# Patient Record
Sex: Male | Born: 1950 | Race: White | Hispanic: No | Marital: Married | Smoking: Never smoker
Health system: Southern US, Community
[De-identification: ages and names within clinical notes are randomized; demographics above are authoritative.]

## PROBLEM LIST (undated history)

## (undated) DIAGNOSIS — J449 Chronic obstructive pulmonary disease, unspecified: Secondary | ICD-10-CM

## (undated) DIAGNOSIS — G4733 Obstructive sleep apnea (adult) (pediatric): Secondary | ICD-10-CM

## (undated) DIAGNOSIS — E1169 Type 2 diabetes mellitus with other specified complication: Secondary | ICD-10-CM

## (undated) DIAGNOSIS — N1831 Chronic kidney disease, stage 3a: Secondary | ICD-10-CM

## (undated) DIAGNOSIS — E785 Hyperlipidemia, unspecified: Secondary | ICD-10-CM

## (undated) DIAGNOSIS — C689 Malignant neoplasm of urinary organ, unspecified: Secondary | ICD-10-CM

## (undated) DIAGNOSIS — I10 Essential (primary) hypertension: Secondary | ICD-10-CM

## (undated) DIAGNOSIS — R0600 Dyspnea, unspecified: Secondary | ICD-10-CM

## (undated) DIAGNOSIS — I2510 Atherosclerotic heart disease of native coronary artery without angina pectoris: Secondary | ICD-10-CM

## (undated) DIAGNOSIS — E273 Drug-induced adrenocortical insufficiency: Secondary | ICD-10-CM

## (undated) DIAGNOSIS — Z1509 Genetic susceptibility to other malignant neoplasm: Secondary | ICD-10-CM

## (undated) DIAGNOSIS — E119 Type 2 diabetes mellitus without complications: Secondary | ICD-10-CM

## (undated) DIAGNOSIS — I152 Hypertension secondary to endocrine disorders: Secondary | ICD-10-CM

## (undated) DIAGNOSIS — C801 Malignant (primary) neoplasm, unspecified: Secondary | ICD-10-CM

## (undated) HISTORY — PX: NEPHRECTOMY: SHX65

## (undated) HISTORY — PX: APPENDECTOMY: SHX54

## (undated) HISTORY — PX: CARDIAC CATHETERIZATION: SHX172

## (undated) HISTORY — PX: COLON RESECTION: SHX5231

---

## 2016-08-16 ENCOUNTER — Other Ambulatory Visit: Payer: Self-pay | Admitting: Orthopedic Surgery

## 2016-08-16 DIAGNOSIS — M545 Low back pain, unspecified: Secondary | ICD-10-CM

## 2016-08-30 ENCOUNTER — Other Ambulatory Visit: Payer: Self-pay

## 2021-07-09 ENCOUNTER — Emergency Department (HOSPITAL_COMMUNITY): Payer: Medicare HMO

## 2021-07-09 ENCOUNTER — Inpatient Hospital Stay (HOSPITAL_COMMUNITY)
Admission: EM | Admit: 2021-07-09 | Discharge: 2021-07-12 | DRG: 522 | Disposition: A | Discharge status: Home Health | Payer: Medicare HMO | Attending: Student | Admitting: Student

## 2021-07-09 ENCOUNTER — Other Ambulatory Visit: Payer: Self-pay

## 2021-07-09 ENCOUNTER — Encounter (HOSPITAL_COMMUNITY): Payer: Self-pay

## 2021-07-09 DIAGNOSIS — D62 Acute posthemorrhagic anemia: Secondary | ICD-10-CM | POA: Diagnosis present

## 2021-07-09 DIAGNOSIS — N1831 Chronic kidney disease, stage 3a: Secondary | ICD-10-CM | POA: Diagnosis present

## 2021-07-09 DIAGNOSIS — W010XXA Fall on same level from slipping, tripping and stumbling without subsequent striking against object, initial encounter: Secondary | ICD-10-CM | POA: Diagnosis present

## 2021-07-09 DIAGNOSIS — S75001A Unspecified injury of femoral artery, right leg, initial encounter: Secondary | ICD-10-CM | POA: Diagnosis not present

## 2021-07-09 DIAGNOSIS — D631 Anemia in chronic kidney disease: Secondary | ICD-10-CM | POA: Diagnosis present

## 2021-07-09 DIAGNOSIS — I2510 Atherosclerotic heart disease of native coronary artery without angina pectoris: Secondary | ICD-10-CM | POA: Diagnosis present

## 2021-07-09 DIAGNOSIS — E1169 Type 2 diabetes mellitus with other specified complication: Secondary | ICD-10-CM | POA: Diagnosis present

## 2021-07-09 DIAGNOSIS — D696 Thrombocytopenia, unspecified: Secondary | ICD-10-CM

## 2021-07-09 DIAGNOSIS — T380X5A Adverse effect of glucocorticoids and synthetic analogues, initial encounter: Secondary | ICD-10-CM | POA: Diagnosis present

## 2021-07-09 DIAGNOSIS — E119 Type 2 diabetes mellitus without complications: Secondary | ICD-10-CM

## 2021-07-09 DIAGNOSIS — E1165 Type 2 diabetes mellitus with hyperglycemia: Secondary | ICD-10-CM | POA: Diagnosis present

## 2021-07-09 DIAGNOSIS — E273 Drug-induced adrenocortical insufficiency: Secondary | ICD-10-CM | POA: Diagnosis not present

## 2021-07-09 DIAGNOSIS — E2749 Other adrenocortical insufficiency: Secondary | ICD-10-CM | POA: Diagnosis present

## 2021-07-09 DIAGNOSIS — R739 Hyperglycemia, unspecified: Secondary | ICD-10-CM

## 2021-07-09 DIAGNOSIS — E1122 Type 2 diabetes mellitus with diabetic chronic kidney disease: Secondary | ICD-10-CM | POA: Diagnosis present

## 2021-07-09 DIAGNOSIS — G4733 Obstructive sleep apnea (adult) (pediatric): Secondary | ICD-10-CM | POA: Diagnosis present

## 2021-07-09 DIAGNOSIS — R350 Frequency of micturition: Secondary | ICD-10-CM | POA: Diagnosis present

## 2021-07-09 DIAGNOSIS — C652 Malignant neoplasm of left renal pelvis: Secondary | ICD-10-CM | POA: Diagnosis present

## 2021-07-09 DIAGNOSIS — Z79899 Other long term (current) drug therapy: Secondary | ICD-10-CM

## 2021-07-09 DIAGNOSIS — S72001A Fracture of unspecified part of neck of right femur, initial encounter for closed fracture: Secondary | ICD-10-CM | POA: Diagnosis present

## 2021-07-09 DIAGNOSIS — W19XXXA Unspecified fall, initial encounter: Secondary | ICD-10-CM | POA: Diagnosis not present

## 2021-07-09 DIAGNOSIS — D638 Anemia in other chronic diseases classified elsewhere: Secondary | ICD-10-CM | POA: Diagnosis not present

## 2021-07-09 DIAGNOSIS — Z85038 Personal history of other malignant neoplasm of large intestine: Secondary | ICD-10-CM

## 2021-07-09 DIAGNOSIS — C689 Malignant neoplasm of urinary organ, unspecified: Secondary | ICD-10-CM | POA: Diagnosis not present

## 2021-07-09 DIAGNOSIS — G8929 Other chronic pain: Secondary | ICD-10-CM | POA: Diagnosis present

## 2021-07-09 DIAGNOSIS — M47816 Spondylosis without myelopathy or radiculopathy, lumbar region: Secondary | ICD-10-CM | POA: Diagnosis present

## 2021-07-09 DIAGNOSIS — Y92009 Unspecified place in unspecified non-institutional (private) residence as the place of occurrence of the external cause: Secondary | ICD-10-CM | POA: Diagnosis not present

## 2021-07-09 DIAGNOSIS — S72011A Unspecified intracapsular fracture of right femur, initial encounter for closed fracture: Secondary | ICD-10-CM | POA: Diagnosis present

## 2021-07-09 DIAGNOSIS — Z1509 Genetic susceptibility to other malignant neoplasm: Secondary | ICD-10-CM | POA: Diagnosis not present

## 2021-07-09 DIAGNOSIS — E785 Hyperlipidemia, unspecified: Secondary | ICD-10-CM | POA: Diagnosis present

## 2021-07-09 DIAGNOSIS — Z905 Acquired absence of kidney: Secondary | ICD-10-CM

## 2021-07-09 DIAGNOSIS — D509 Iron deficiency anemia, unspecified: Secondary | ICD-10-CM | POA: Diagnosis present

## 2021-07-09 DIAGNOSIS — I10 Essential (primary) hypertension: Secondary | ICD-10-CM | POA: Diagnosis present

## 2021-07-09 DIAGNOSIS — Z91041 Radiographic dye allergy status: Secondary | ICD-10-CM

## 2021-07-09 DIAGNOSIS — R7303 Prediabetes: Secondary | ICD-10-CM | POA: Insufficient documentation

## 2021-07-09 DIAGNOSIS — Z85068 Personal history of other malignant neoplasm of small intestine: Secondary | ICD-10-CM

## 2021-07-09 DIAGNOSIS — I129 Hypertensive chronic kidney disease with stage 1 through stage 4 chronic kidney disease, or unspecified chronic kidney disease: Secondary | ICD-10-CM | POA: Diagnosis not present

## 2021-07-09 DIAGNOSIS — E1159 Type 2 diabetes mellitus with other circulatory complications: Secondary | ICD-10-CM | POA: Diagnosis not present

## 2021-07-09 DIAGNOSIS — Z8553 Personal history of malignant neoplasm of renal pelvis: Secondary | ICD-10-CM

## 2021-07-09 DIAGNOSIS — Z7982 Long term (current) use of aspirin: Secondary | ICD-10-CM

## 2021-07-09 DIAGNOSIS — C787 Secondary malignant neoplasm of liver and intrahepatic bile duct: Secondary | ICD-10-CM | POA: Diagnosis present

## 2021-07-09 DIAGNOSIS — Z951 Presence of aortocoronary bypass graft: Secondary | ICD-10-CM

## 2021-07-09 DIAGNOSIS — Z955 Presence of coronary angioplasty implant and graft: Secondary | ICD-10-CM

## 2021-07-09 DIAGNOSIS — Z20822 Contact with and (suspected) exposure to covid-19: Secondary | ICD-10-CM | POA: Diagnosis present

## 2021-07-09 DIAGNOSIS — I152 Hypertension secondary to endocrine disorders: Secondary | ICD-10-CM | POA: Diagnosis present

## 2021-07-09 HISTORY — DX: Atherosclerotic heart disease of native coronary artery without angina pectoris: I25.10

## 2021-07-09 HISTORY — DX: Drug-induced adrenocortical insufficiency: E27.3

## 2021-07-09 HISTORY — DX: Essential (primary) hypertension: I10

## 2021-07-09 HISTORY — DX: Type 2 diabetes mellitus with other specified complication: E11.69

## 2021-07-09 HISTORY — DX: Genetic susceptibility to other malignant neoplasm: Z15.09

## 2021-07-09 HISTORY — DX: Hypertension secondary to endocrine disorders: I15.2

## 2021-07-09 HISTORY — DX: Malignant (primary) neoplasm, unspecified: C80.1

## 2021-07-09 HISTORY — DX: Chronic kidney disease, stage 3a: N18.31

## 2021-07-09 HISTORY — DX: Type 2 diabetes mellitus with other specified complication: E78.5

## 2021-07-09 HISTORY — DX: Type 2 diabetes mellitus without complications: E11.9

## 2021-07-09 HISTORY — DX: Chronic obstructive pulmonary disease, unspecified: J44.9

## 2021-07-09 HISTORY — DX: Dyspnea, unspecified: R06.00

## 2021-07-09 HISTORY — DX: Malignant neoplasm of urinary organ, unspecified: C68.9

## 2021-07-09 HISTORY — DX: Obstructive sleep apnea (adult) (pediatric): G47.33

## 2021-07-09 LAB — COMPREHENSIVE METABOLIC PANEL
ALT: 24 U/L (ref 0–44)
AST: 24 U/L (ref 15–41)
Albumin: 4 g/dL (ref 3.5–5.0)
Alkaline Phosphatase: 63 U/L (ref 38–126)
Anion gap: 9 (ref 5–15)
BUN: 29 mg/dL — ABNORMAL HIGH (ref 8–23)
CO2: 25 mmol/L (ref 22–32)
Calcium: 9.7 mg/dL (ref 8.9–10.3)
Chloride: 103 mmol/L (ref 98–111)
Creatinine, Ser: 1.31 mg/dL — ABNORMAL HIGH (ref 0.61–1.24)
GFR, Estimated: 58 mL/min — ABNORMAL LOW (ref >60)
Glucose, Bld: 114 mg/dL — ABNORMAL HIGH (ref 70–99)
Potassium: 4.1 mmol/L (ref 3.5–5.1)
Sodium: 137 mmol/L (ref 135–145)
Total Bilirubin: 1 mg/dL (ref 0.3–1.2)
Total Protein: 6.8 g/dL (ref 6.5–8.1)

## 2021-07-09 LAB — CBC WITH DIFFERENTIAL/PLATELET
Abs Immature Granulocytes: 0.1 10*3/uL — ABNORMAL HIGH (ref 0.00–0.07)
Basophils Absolute: 0 10*3/uL (ref 0.0–0.1)
Basophils Relative: 0 %
Eosinophils Absolute: 0 10*3/uL (ref 0.0–0.5)
Eosinophils Relative: 0 %
HCT: 38.9 % — ABNORMAL LOW (ref 39.0–52.0)
Hemoglobin: 12.8 g/dL — ABNORMAL LOW (ref 13.0–17.0)
Immature Granulocytes: 1 %
Lymphocytes Relative: 6 %
Lymphs Abs: 0.6 10*3/uL — ABNORMAL LOW (ref 0.7–4.0)
MCH: 28.9 pg (ref 26.0–34.0)
MCHC: 32.9 g/dL (ref 30.0–36.0)
MCV: 87.8 fL (ref 80.0–100.0)
Monocytes Absolute: 0.4 10*3/uL (ref 0.1–1.0)
Monocytes Relative: 4 %
Neutro Abs: 8.7 10*3/uL — ABNORMAL HIGH (ref 1.7–7.7)
Neutrophils Relative %: 89 %
Platelets: 179 10*3/uL (ref 150–400)
RBC: 4.43 MIL/uL (ref 4.22–5.81)
RDW: 13.4 % (ref 11.5–15.5)
WBC: 9.9 10*3/uL (ref 4.0–10.5)
nRBC: 0 % (ref 0.0–0.2)

## 2021-07-09 LAB — PROTIME-INR
INR: 1.1 (ref 0.8–1.2)
Prothrombin Time: 14.4 seconds (ref 11.4–15.2)

## 2021-07-09 LAB — TROPONIN I (HIGH SENSITIVITY)
Troponin I (High Sensitivity): 11 ng/L (ref <18)
Troponin I (High Sensitivity): 13 ng/L (ref <18)

## 2021-07-09 LAB — APTT: aPTT: 25 seconds (ref 24–36)

## 2021-07-09 MED ORDER — ACETAMINOPHEN 325 MG PO TABS
650.0000 mg | ORAL_TABLET | Freq: Four times a day (QID) | ORAL | Status: DC | PRN
  Administered 2021-07-09: 650 mg via ORAL
  Filled 2021-07-09: qty 2

## 2021-07-09 MED ORDER — OXYCODONE-ACETAMINOPHEN 5-325 MG PO TABS
2.0000 | ORAL_TABLET | Freq: Once | ORAL | Status: AC
  Administered 2021-07-09: 2 via ORAL
  Filled 2021-07-09: qty 2

## 2021-07-09 MED ORDER — SODIUM CHLORIDE 0.9 % IV SOLN: INTRAVENOUS | Status: DC

## 2021-07-09 NOTE — Consult Note (Signed)
Reason for Consult:R hip fracture Referring Physician: [facility] ER  Ian Gray is an 71 y.o. male.  HPI: Patient was seen earlier this afternoon by Dr. Alfonso Ramus at [facility] with c/o R hip pain following a mechanical fall this morning.  Xrays revealed minimally displaced subcapital fracture and he as sent to [facility] ER for admission and workup for planned R hip hemiarthroplasty tomorrow with Dr. French Ana.  Past Medical History:  Diagnosis Date   Cancer ([place])    Hypertension    Lynch syndrome     Past Surgical History:  Procedure Laterality Date   APPENDECTOMY     COLON RESECTION     NEPHRECTOMY      No family history on file.  Social History:  reports that he has never smoked. He has never used smokeless tobacco. He reports that he does not drink alcohol and does not use drugs.  Allergies:  Allergies  Allergen Reactions   Ivp Dye [Iodinated Contrast Media] Hives    Medications: I have reviewed the patient's current medications.  Results for orders placed or performed during the hospital encounter of 07/09/21 (from the past 48 hour(s))  CBC with Differential     Status: Abnormal   Collection Time: 07/09/21  6:07 PM  Result Value Ref Range   WBC 9.9 4.0 - 10.5 K/uL   RBC 4.43 4.22 - 5.81 MIL/uL   Hemoglobin 12.8 (L) 13.0 - 17.0 g/dL   HCT 38.9 (L) 39.0 - 52.0 %   MCV 87.8 80.0 - 100.0 fL   MCH 28.9 26.0 - 34.0 pg   MCHC 32.9 30.0 - 36.0 g/dL   RDW 13.4 11.5 - 15.5 %   Platelets 179 150 - 400 K/uL   nRBC 0.0 0.0 - 0.2 %   Neutrophils Relative % 89 %   Neutro Abs 8.7 (H) 1.7 - 7.7 K/uL   Lymphocytes Relative 6 %   Lymphs Abs 0.6 (L) 0.7 - 4.0 K/uL   Monocytes Relative 4 %   Monocytes Absolute 0.4 0.1 - 1.0 K/uL   Eosinophils Relative 0 %   Eosinophils Absolute 0.0 0.0 - 0.5 K/uL   Basophils Relative 0 %   Basophils Absolute 0.0 0.0 - 0.1 K/uL   Immature Granulocytes 1 %   Abs Immature Granulocytes 0.10 (H) 0.00 - 0.07 K/uL    Comment: Performed at [facility], [address] [place] [address] [place], [place] [address]  Comprehensive metabolic panel     Status: Abnormal   Collection Time: 07/09/21  6:07 PM  Result Value Ref Range   Sodium 137 135 - 145 mmol/L   Potassium 4.1 3.5 - 5.1 mmol/L   Chloride 103 98 - 111 mmol/L   CO2 25 22 - 32 mmol/L   Glucose, Bld 114 (H) 70 - 99 mg/dL    Comment: Glucose reference range applies only to samples taken after fasting for at least 8 hours.   BUN 29 (H) 8 - 23 mg/dL   Creatinine, Ser 1.31 (H) 0.61 - 1.24 mg/dL   Calcium 9.7 8.9 - 10.3 mg/dL   Total Protein 6.8 6.5 - 8.1 g/dL   Albumin 4.0 3.5 - 5.0 g/dL   AST 24 15 - 41 U/L   ALT 24 0 - 44 U/L   Alkaline Phosphatase 63 38 - 126 U/L   Total Bilirubin 1.0 0.3 - 1.2 mg/dL   GFR, Estimated 58 (L) >60 mL/min    Comment: (NOTE) Calculated using the CKD-EPI Creatinine Equation (2021)  Anion gap 9 5 - 15    Comment: Performed at [facility] [address], [place], [place] [address]  Protime-INR     Status: None   Collection Time: 07/09/21  6:07 PM  Result Value Ref Range   Prothrombin Time 14.4 11.4 - 15.2 seconds   INR 1.1 0.8 - 1.2    Comment: (NOTE) INR goal varies based on device and disease states. Performed at [facility], [place] [address] [place], [place] [address]   APTT     Status: None   Collection Time: 07/09/21  6:07 PM  Result Value Ref Range   aPTT 25 24 - 36 seconds    Comment: Performed at [facility] [address], [place], [place] [address]  Troponin I (High Sensitivity)     Status: None   Collection Time: 07/09/21  7:41 PM  Result Value Ref Range   Troponin I (High Sensitivity) 11 <18 ng/L    Comment: (NOTE) Elevated high sensitivity troponin I (hsTnI) values and significant  changes across serial measurements may suggest ACS but many other  chronic and acute conditions are known to elevate hsTnI results.  Refer to the "Links" section for chest pain algorithms and additional  guidance. Performed at  [facility], [place] [address] [place], [place] [address]     CT Head Wo Contrast  Result Date: 07/09/2021 CLINICAL DATA:  Head trauma, minor (Age >= 65y).  Fall. EXAM: CT HEAD WITHOUT CONTRAST TECHNIQUE: Contiguous axial images were obtained from the base of the skull through the vertex without intravenous contrast. RADIATION DOSE REDUCTION: This exam was performed according to the departmental dose-optimization program which includes automated exposure control, adjustment of the mA and/or kV according to patient size and/or use of iterative reconstruction technique. COMPARISON:  None Available. FINDINGS: Brain: No acute intracranial abnormality. Specifically, no hemorrhage, hydrocephalus, mass lesion, acute infarction, or significant intracranial injury. Vascular: No hyperdense vessel or unexpected calcification. Skull: No acute calvarial abnormality. Sinuses/Orbits: No acute findings Other: None IMPRESSION: No acute intracranial abnormality. Electronically Signed   By: Rolm Baptise M.D.   On: 07/09/2021 19:23   DG Chest Port 1 View  Result Date: 07/09/2021 CLINICAL DATA:  [id number].  Preop EXAM: PORTABLE CHEST 1 VIEW COMPARISON:  Chest x-ray 05/16/2019, CT chest 01/31/2019 FINDINGS: Right chest wall Port-A-Cath with tip overlying the right atrium. The heart and mediastinal contours are unchanged. Atherosclerotic plaque. Coronary artery calcification. No focal consolidation. No pulmonary edema. No pleural effusion. No pneumothorax. No acute osseous abnormality. IMPRESSION: 1. No active disease. 2.  Aortic Atherosclerosis (ICD10-I70.0). Electronically Signed   By: Iven Finn M.D.   On: 07/09/2021 19:00   DG Hip Unilat W or Wo Pelvis 2-3 Views Right  Result Date: 07/09/2021 CLINICAL DATA:  Right hip fracture EXAM: DG HIP (WITH OR WITHOUT PELVIS) 2-3V RIGHT COMPARISON:  None Available. FINDINGS: There is an acute right subcapital femoral neck fracture with mild external rotation of the distal  fracture fragment. Femoral head appears seated within the right acetabulum. Mild superimposed right hip degenerative arthritis. Visualized left hip appears intact. Pelvis appears intact. Soft tissues are unremarkable. IMPRESSION: Acute right subcapital femoral neck fracture.  No dislocation. Electronically Signed   By: Fidela Salisbury M.D.   On: 07/09/2021 18:43    Review of Systems  Constitutional:  Negative for fever.  Respiratory:  Negative for shortness of breath.   Cardiovascular:  Negative for chest pain.  Musculoskeletal:  Negative for  gait problem.  Neurological:  Negative for syncope.  Psychiatric/Behavioral:  Negative for confusion.   All other systems reviewed and are negative.  Blood pressure (!) 157/68, pulse 77, temperature 99.8 F (37.7 C), temperature source Oral, resp. rate 16, height 6' (1.829 m), weight 95.3 kg, SpO2 97 %. Physical Exam Constitutional:      Appearance: Normal appearance.  HENT:     Head: Normocephalic.  Eyes:     Extraocular Movements: Extraocular movements intact.     Pupils: Pupils are equal, round, and reactive to light.  Cardiovascular:     Rate and Rhythm: Normal rate.     Pulses: Normal pulses.  Pulmonary:     Effort: Pulmonary effort is normal. No respiratory distress.  Abdominal:     General: Abdomen is flat. There is no distension.     Palpations: Abdomen is soft.  Musculoskeletal:     Cervical back: Normal range of motion.     Comments: RLE NVI, good distal pulses, DF/PF ankle intact, no calf TTP, pain with ROM R hip, leg in slight external rotation in bed.    Skin:    General: Skin is warm and dry.     Findings: No erythema or rash.  Neurological:     General: No focal deficit present.     Mental Status: He is alert and oriented to person, place, and time.  Psychiatric:        Mood and Affect: Mood normal.        Behavior: Behavior normal.     Assessment/Plan: R subcapital hip fracture minimally displaced.    Son at bedside,  we all discussed the plan. Plan is for OR tomorrow with Dr. French Ana for R hip hemiarthroplasty.  He should be admitted per protocol. He will remain NPO after midnight tonight.    Chriss Czar 07/09/2021, 8:46 PM

## 2021-07-09 NOTE — ED Triage Notes (Signed)
Pt's orthopedic Dr Alfonso Ramus sent him here to be admitted to the medicine service in anticipation of ORIF of right hip in AM. Dr French Ana is to be contacted upon pt arrival here.

## 2021-07-09 NOTE — H&P (Signed)
History and Physical    Ian Gray RCV:[id number] DOB: [birth year] DOA: 07/09/2021  PCP: Kristine Linea, MD  Patient coming from: Orthopedic office  I have personally briefly reviewed patient's old medical records in [place]  Chief Complaint: Right hip fracture  HPI: Ian Gray is a 71 y.o. 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  ***  ED Course  Labs/Imaging on admission: I have personally reviewed following labs and imaging studies.  ***  Review of Systems: All systems reviewed and are negative except as documented in history of present illness above.   Past Medical History:  Diagnosis Date   Adrenal insufficiency due to cancer therapy [facility])    CAD (coronary artery disease)    S/p CABG and DES April 2021   Cancer [facility])    Chronic kidney disease, stage 3a ([place])    Hyperlipidemia associated with type 2 diabetes mellitus ([place])    Hypertension    Hypertension associated with diabetes ([place])    Lynch syndrome    OSA (obstructive sleep apnea)    Type 2 diabetes mellitus ([place])    Urothelial cancer ([place])     Past Surgical History:  Procedure Laterality Date   APPENDECTOMY     COLON RESECTION     NEPHRECTOMY      Social History:  reports that he has never smoked. He has never used smokeless tobacco. He reports that he does not drink alcohol and does not use drugs.  Allergies  Allergen Reactions   Ivp Dye [Iodinated Contrast Media] Hives    No  family history on file.   Prior to Admission medications   Medication Sig Start Date End Date Taking? Authorizing Provider  amLODipine (NORVASC) 5 MG tablet Take 5 mg by mouth at bedtime. 05/04/21  Yes [provider]  atorvastatin (LIPITOR) 40 MG tablet Take 40 mg by mouth at bedtime. 07/08/21  Yes [provider]  Erdafitinib (BALVERSA) 4 MG TABS Take 4 mg by mouth at bedtime.   Yes [provider]  ezetimibe (ZETIA) 10 MG tablet Take 10 mg by mouth at bedtime. 06/16/21  Yes [provider]  gabapentin (NEURONTIN) 800 MG tablet Take 800 mg by mouth 4 (four) times daily. 06/09/21  Yes [provider]  HYDROcodone-acetaminophen (NORCO) 10-325 MG tablet Take 1 tablet by mouth 3 (three) times daily. 06/25/21  Yes [provider]  hydrocortisone (CORTEF) 10 MG tablet Take 20 mg by mouth daily. 04/01/21  Yes [provider]  hydrocortisone (CORTEF) 5 MG tablet Take 5 mg by mouth daily at 4 PM. 04/12/21  Yes [provider]  NONFORMULARY OR COMPOUNDED ITEM 1 Dose by Mouth Rinse route at bedtime. Franqui mouthwash- dry mouth/mouth sores caused by chemo   Yes [provider]  ondansetron (ZOFRAN) 8 MG tablet Take 8 mg by mouth every 8 (eight) hours as needed for nausea or vomiting. 02/24/21  Yes [provider]  pantoprazole (PROTONIX) 40 MG tablet Take 40 mg by mouth at bedtime. 05/12/21  Yes [provider]  Polyethyl Glycol-Propyl Glycol (SYSTANE OP) Place 2  drops into both eyes daily.   Yes [provider]  prochlorperazine (COMPAZINE) 10 MG tablet Take 10 mg by mouth every 6 (six) hours as needed for vomiting or nausea. 02/24/21  Yes [provider]  rOPINIRole (REQUIP XL) 2 MG 24 hr tablet Take 2 mg by mouth at bedtime. 05/11/21  Yes [provider]  tamsulosin (FLOMAX) 0.4 MG CAPS capsule Take 0.4 mg by mouth at bedtime. 04/16/21  Yes [provider]  Vitamin D, Ergocalciferol,  (DRISDOL) 1.25 MG (50000 UNIT) CAPS capsule Take 50,000 Units by mouth every Sunday. 05/11/21  Yes [provider]  zolpidem (AMBIEN) 10 MG tablet Take 10 mg by mouth at bedtime. 06/17/21  Yes [provider]    Physical Exam: Vitals:   07/09/21 2115 07/09/21 2130 07/09/21 2255 07/09/21 2300  BP: (!) 162/78 (!) 178/78  (!) 174/81  Pulse: 93 95  94  Resp:  18    Temp:   (!) 100.4 F (38 C)   TempSrc:   Oral   SpO2: 96% 96%  97%  Weight:      Height:       *** Constitutional: NAD, calm, comfortable Eyes: PERRL, lids and conjunctivae normal ENMT: Mucous membranes are moist. Posterior pharynx clear of any exudate or lesions.Normal dentition.  Neck: normal, supple, no masses. Respiratory: clear to auscultation bilaterally, no wheezing, no crackles. Normal respiratory effort. No accessory muscle use.  Cardiovascular: Regular rate and rhythm, no murmurs / rubs / gallops. No extremity edema. 2+ pedal pulses. Abdomen: no tenderness, no masses palpated. No hepatosplenomegaly. Bowel sounds positive.  Musculoskeletal: no clubbing / cyanosis. No joint deformity upper and lower extremities. Good ROM, no contractures. Normal muscle tone.  Skin: no rashes, lesions, ulcers. No induration Neurologic: CN 2-12 grossly intact. Sensation intact. Strength 5/5 in all 4.  Psychiatric: Normal judgment and insight. Alert and oriented x 3. Normal mood.   EKG: Personally reviewed. Limited interpretation due to motion artifact, appears sinus with incomplete RBBB.  No prior for comparison.  Assessment/Plan Principal Problem:   Fracture of femoral neck, right, closed (HCC) Active Problems:   Adrenal insufficiency due to cancer therapy ([place])   CAD (coronary artery disease)   Urothelial cancer (HCC)   Chronic kidney disease, stage 3a ([place])   Hypertension associated with diabetes ([place])   Type 2 diabetes mellitus ([place])   Hyperlipidemia associated with type 2 diabetes mellitus ([place])   Lynch  syndrome   OSA (obstructive sleep apnea)   *** No notes on file *** Assessment and Plan: No notes have been filed under this hospital service. Service: Hospitalist DVT prophylaxis: ***  Code Status: ***  Family Communication: ***  Disposition Plan: ***  Consults called: Orthopedics Severity of Illness: The appropriate patient status for this patient is INPATIENT. Inpatient status is judged to be reasonable and necessary in order to provide the required intensity of service to ensure the patient's safety. The patient's presenting symptoms, physical exam findings, and initial radiographic and laboratory data in the context of their chronic comorbidities is felt to place them at high risk for further clinical deterioration. Furthermore, it is not anticipated that the patient will be medically stable for discharge from the hospital within 2 midnights of admission.   * I certify that at the point of admission it is my clinical judgment that the patient will require inpatient hospital care spanning beyond 2 midnights from the point of admission due to high intensity of service, high risk for further deterioration and  high frequency of surveillance required.Zada Finders MD [facility]  If 7PM-7AM, please contact night-coverage [email or web address]  07/09/2021, 11:44 PM

## 2021-07-09 NOTE — ED Provider Triage Note (Signed)
Emergency Medicine Provider Triage Evaluation Note  Ian Gray , a 71 y.o. male  was evaluated in triage.  Pt had a fall this morning.  Seen at orthopedics.  He was noted to have a right-sided femoral neck fracture.  Sent here to be admitted for over from the morning with Dr. French Ana.  Currently being treated for kidney cancer. Review of Systems  Positive: Pain Negative:   Physical Exam  BP 137/90 (BP Location: Right Arm)   Pulse 92   Temp 99.8 F (37.7 C) (Oral)   Resp 16   Ht 6' (1.829 m)   Wt 95.3 kg   SpO2 97%   BMI 28.48 kg/m  Gen:   Awake, no distress   Resp:  Normal effort  MSK:   Moves extremities without difficulty  Other:    Medical Decision Making  Medically screening exam initiated at 6:09 PM.  Appropriate orders placed.  Gwendolyn Grant was informed that the remainder of the evaluation will be completed by another provider, this initial triage assessment does not replace that evaluation, and the importance of remaining in the ED until their evaluation is complete.  I personally spoke with Dr. French Ana.  He reports the patient does need to be admitted to medicine for him to operate on his first case tomorrow morning.  Will obtain plain films, said he did not need any CT imaging.   Rhae Hammock, [place] 07/09/21 1835

## 2021-07-09 NOTE — Anesthesia Preprocedure Evaluation (Signed)
Anesthesia Evaluation  Patient identified by MRN, date of birth, ID band Patient awake    Reviewed: Allergy & Precautions, NPO status , Patient's Chart, lab work & pertinent test results  Airway Mallampati: II  TM Distance: >3 FB Neck ROM: Full    Dental no notable dental hx.    Pulmonary sleep apnea , COPD,    Pulmonary exam normal        Cardiovascular hypertension, + CAD and + Cardiac Stents   Rhythm:Regular Rate:Normal     Neuro/Psych negative neurological ROS  negative psych ROS   GI/Hepatic negative GI ROS, Neg liver ROS,   Endo/Other  diabetes  Renal/GU CRFRenal disease  negative genitourinary   Musculoskeletal Right hip fx   Abdominal Normal abdominal exam  (+)   Peds  Hematology negative hematology ROS (+)   Anesthesia Other Findings   Reproductive/Obstetrics                           Anesthesia Physical Anesthesia Plan  ASA: 3  Anesthesia Plan: General and Regional   Post-op Pain Management: Regional block* and Tylenol PO (pre-op)*   Induction: Intravenous  PONV Risk Score and Plan: 2 and Ondansetron, Dexamethasone and Treatment may vary due to age or medical condition  Airway Management Planned: Mask and Oral ETT  Additional Equipment: None  Intra-op Plan:   Post-operative Plan: Extubation in OR  Informed Consent: I have reviewed the patients History and Physical, chart, labs and discussed the procedure including the risks, benefits and alternatives for the proposed anesthesia with the patient or authorized representative who has indicated his/her understanding and acceptance.     Dental advisory given  Plan Discussed with: CRNA  Anesthesia Plan Comments: (Lab Results      Component                Value               Date                      WBC                      7.5                 07/10/2021                HGB                      11.5 (L)             07/10/2021                HCT                      34.4 (L)            07/10/2021                MCV                      85.4                07/10/2021                PLT                      139 (L)  07/10/2021           Lab Results      Component                Value               Date                      NA                       137                 07/10/2021                K                        3.7                 07/10/2021                CO2                      25                  07/10/2021                GLUCOSE                  102 (H)             07/10/2021                BUN                      22                  07/10/2021                CREATININE               1.42 (H)            07/10/2021                CALCIUM                  8.8 (L)             07/10/2021                GFRNONAA                 53 (L)              07/10/2021          )       Anesthesia Quick Evaluation

## 2021-07-09 NOTE — ED Provider Notes (Signed)
[facility] EMERGENCY DEPARTMENT Provider Note   CSN: [id number] Arrival date & time: 07/09/21  1621     History  Chief Complaint  Patient presents with   hip surgery    Ian Gray is a 71 y.o. male.  Patient sent in from [facility] was seen by Dr. Lurline Hare there.  Patient sent here to the ED for admission.  Dr. French Ana is planning to operating on him tomorrow morning.  Patient was cleaning at home had a fall has a fracture to his right hip.  Patient also has a bump to the right side of his head.  Patient is also a cancer patient being followed by [facility] hematology oncology in the middle of chemotherapy.  Patient has Lynch syndrome.  Had a left nephrectomy in 21.  Had recurrence in late 22.  Specialized imaging for orthopedics ordered by the triage PA.  Labs ordered as well.  Patient apparently not on any blood thinners.  Past medical history significant for the Lynch syndrome hypertension.  Patient's had left nephrectomy appendectomy and colon resection.  Once we got her additional imaging and labs back we will consult with Dr. French Ana and also contact the hospitalist for unassigned for admission.  In discussion with his son.  Patient hit the right side of his head when he went down when he fell at home.  He was actually walking on the leg.  Raliegh Ip confirmed a right femur fracture.  Patient's EKG raises some concern for subtle ST changes.       Home Medications Prior to Admission medications   Medication Sig Start Date End Date Taking? Authorizing Provider  amLODipine (NORVASC) 5 MG tablet Take 5 mg by mouth at bedtime. 05/04/21  Yes [provider]  atorvastatin (LIPITOR) 40 MG tablet Take 40 mg by mouth at bedtime. 07/08/21  Yes [provider]  ezetimibe (ZETIA) 10 MG tablet Take 10 mg by mouth at bedtime. 06/16/21  Yes [provider]  gabapentin (NEURONTIN) 800 MG tablet Take 800 mg by mouth 4 (four) times daily.  06/09/21  Yes [provider]  HYDROcodone-acetaminophen (NORCO) 10-325 MG tablet Take 1 tablet by mouth 3 (three) times daily. 06/25/21  Yes [provider]  hydrocortisone (CORTEF) 10 MG tablet Take 20 mg by mouth daily. 04/01/21  Yes [provider]  hydrocortisone (CORTEF) 5 MG tablet Take 5 mg by mouth daily at 4 PM. 04/12/21  Yes [provider]  ondansetron (ZOFRAN) 8 MG tablet Take 8 mg by mouth every 8 (eight) hours as needed for nausea or vomiting. 02/24/21  Yes [provider]  pantoprazole (PROTONIX) 40 MG tablet Take 40 mg by mouth at bedtime. 05/12/21  Yes [provider]  prochlorperazine (COMPAZINE) 10 MG tablet Take 10 mg by mouth every 6 (six) hours as needed for vomiting or nausea. 02/24/21  Yes [provider]  rOPINIRole (REQUIP XL) 2 MG 24 hr tablet Take 2 mg by mouth at bedtime. 05/11/21  Yes [provider]  tamsulosin (FLOMAX) 0.4 MG CAPS capsule Take 0.4 mg by mouth at bedtime. 04/16/21  Yes [provider]  Vitamin D, Ergocalciferol, (DRISDOL) 1.25 MG (50000 UNIT) CAPS capsule Take 50,000 Units by mouth every Sunday. 05/11/21  Yes [provider]  zolpidem (AMBIEN) 10 MG tablet Take 10 mg by mouth at bedtime. 06/17/21  Yes [provider]      Allergies    Ivp dye [iodinated contrast media]    Review of  Systems   Review of Systems  Constitutional:  Negative for chills and fever.  HENT:  Negative for ear pain and sore throat.   Eyes:  Negative for pain and visual disturbance.  Respiratory:  Negative for cough and shortness of breath.   Cardiovascular:  Negative for chest pain and palpitations.  Gastrointestinal:  Negative for abdominal pain and vomiting.  Genitourinary:  Negative for dysuria and hematuria.  Musculoskeletal:  Negative for arthralgias and back pain.  Skin:  Negative for color change and rash.  Neurological:  Negative for seizures and syncope.  All other systems  reviewed and are negative.   Physical Exam Updated Vital Signs BP (!) 174/81   Pulse 94   Temp (!) 100.4 F (38 C) (Oral)   Resp 18   Ht 1.829 m (6')   Wt 95.3 kg   SpO2 97%   BMI 28.48 kg/m  Physical Exam Vitals and nursing note reviewed.  Constitutional:      General: He is not in acute distress.    Appearance: Normal appearance. He is well-developed.  HENT:     Head: Normocephalic.     Comments: Little abrasion to the right side of the head. Eyes:     Extraocular Movements: Extraocular movements intact.     Conjunctiva/sclera: Conjunctivae normal.     Pupils: Pupils are equal, round, and reactive to light.  Cardiovascular:     Rate and Rhythm: Normal rate and regular rhythm.     Heart sounds: No murmur heard. Pulmonary:     Effort: Pulmonary effort is normal. No respiratory distress.     Breath sounds: Normal breath sounds.  Abdominal:     Palpations: Abdomen is soft.     Tenderness: There is no abdominal tenderness.  Musculoskeletal:        General: No swelling.     Cervical back: Neck supple.     Comments: Right foot 1+ dorsalis pedis pulse.  Left foot with 1+ dorsalis pedis pulse.  Skin:    General: Skin is warm and dry.     Capillary Refill: Capillary refill takes less than 2 seconds.  Neurological:     General: No focal deficit present.     Mental Status: He is alert and oriented to person, place, and time.     Cranial Nerves: No cranial nerve deficit.     Sensory: No sensory deficit.  Psychiatric:        Mood and Affect: Mood normal.     ED Results / Procedures / Treatments   Labs (all labs ordered are listed, but only abnormal results are displayed) Labs Reviewed  CBC WITH DIFFERENTIAL/PLATELET - Abnormal; Notable for the following components:      Result Value   Hemoglobin 12.8 (*)    HCT 38.9 (*)    Neutro Abs 8.7 (*)    Lymphs Abs 0.6 (*)    Abs Immature Granulocytes 0.10 (*)    All other components within normal limits  COMPREHENSIVE  METABOLIC PANEL - Abnormal; Notable for the following components:   Glucose, Bld 114 (*)    BUN 29 (*)    Creatinine, Ser 1.31 (*)    GFR, Estimated 58 (*)    All other components within normal limits  SARS CORONAVIRUS 2 BY RT PCR  MRSA CULTURE  PROTIME-INR  APTT  TROPONIN I (HIGH SENSITIVITY)  TROPONIN I (HIGH SENSITIVITY)    EKG EKG Interpretation  Date/Time:  Friday July 09 2021 18:06:37 EDT Ventricular Rate:  96 PR  Interval:    QRS Duration: 106 QT Interval:  352 QTC Calculation: 444 R Axis:   49 Text Interpretation: Accelerated Junctional rhythm Incomplete right bundle branch block ST & T wave abnormality, consider anterior ischemia Abnormal ECG No previous ECGs available No previous ECGs available Confirmed by Fredia Sorrow [phone]) on 07/09/2021 7:20:16 PM  Radiology CT Head Wo Contrast  Result Date: 07/09/2021 CLINICAL DATA:  Head trauma, minor (Age >= 65y).  Fall. EXAM: CT HEAD WITHOUT CONTRAST TECHNIQUE: Contiguous axial images were obtained from the base of the skull through the vertex without intravenous contrast. RADIATION DOSE REDUCTION: This exam was performed according to the departmental dose-optimization program which includes automated exposure control, adjustment of the mA and/or kV according to patient size and/or use of iterative reconstruction technique. COMPARISON:  None Available. FINDINGS: Brain: No acute intracranial abnormality. Specifically, no hemorrhage, hydrocephalus, mass lesion, acute infarction, or significant intracranial injury. Vascular: No hyperdense vessel or unexpected calcification. Skull: No acute calvarial abnormality. Sinuses/Orbits: No acute findings Other: None IMPRESSION: No acute intracranial abnormality. Electronically Signed   By: Rolm Baptise M.D.   On: 07/09/2021 19:23   DG Chest Port 1 View  Result Date: 07/09/2021 CLINICAL DATA:  [id number].  Preop EXAM: PORTABLE CHEST 1 VIEW COMPARISON:  Chest x-ray 05/16/2019, CT chest 01/31/2019  FINDINGS: Right chest wall Port-A-Cath with tip overlying the right atrium. The heart and mediastinal contours are unchanged. Atherosclerotic plaque. Coronary artery calcification. No focal consolidation. No pulmonary edema. No pleural effusion. No pneumothorax. No acute osseous abnormality. IMPRESSION: 1. No active disease. 2.  Aortic Atherosclerosis (ICD10-I70.0). Electronically Signed   By: Iven Finn M.D.   On: 07/09/2021 19:00   DG Hip Unilat W or Wo Pelvis 2-3 Views Right  Result Date: 07/09/2021 CLINICAL DATA:  Right hip fracture EXAM: DG HIP (WITH OR WITHOUT PELVIS) 2-3V RIGHT COMPARISON:  None Available. FINDINGS: There is an acute right subcapital femoral neck fracture with mild external rotation of the distal fracture fragment. Femoral head appears seated within the right acetabulum. Mild superimposed right hip degenerative arthritis. Visualized left hip appears intact. Pelvis appears intact. Soft tissues are unremarkable. IMPRESSION: Acute right subcapital femoral neck fracture.  No dislocation. Electronically Signed   By: Fidela Salisbury M.D.   On: 07/09/2021 18:43    Procedures Procedures    Medications Ordered in ED Medications  0.9 %  sodium chloride infusion (has no administration in time range)  acetaminophen (TYLENOL) tablet 650 mg (has no administration in time range)  oxyCODONE-acetaminophen (PERCOCET/ROXICET) 5-325 MG per tablet 2 tablet (2 tablets Oral Given 07/09/21 1808)    ED Course/ Medical Decision Making/ A&P                           Medical Decision Making Amount and/or Complexity of Data Reviewed Radiology: ordered.  Risk OTC drugs. Prescription drug management. Decision regarding hospitalization.   Patient EKG rates with concerns about some subtle changes.  Questionable anterior ischemia.  Troponins x2 first 1 was 11-second 1 was 13 no acute change.  CBC no leukocytosis hemoglobin 12.8.  Platelets 179.  Complete metabolic panel significant for BUN of 29  creatinine of 1.31 for a GFR 58.  Do not have a comparison.   CT head without any acute findings.  X-rays of the right hip shows a subcapital femoral neck fracture on the right side.  Chest x-ray no active disease.  Patient INR is normal at 1.1.  Patient followed by  [place] cardiology followed by hematology oncology [facility].  Actively receiving chemotherapy.  For the metastatic urothelial cancer.  Patient's had a left nephrectomy for that.  Just notified by nursing that he is now got a temp to 100.5.  Patient will be given some Tylenol.  Discussed with hospitalist they will admit.  Patient is currently slotted for repair of the hip first thing in the morning by [place].   Final Clinical Impression(s) / ED Diagnoses Final diagnoses:  Fall, initial encounter  Closed fracture of neck of right femur, initial encounter [facility])    Rx / DC Orders ED Discharge Orders     None         Fredia Sorrow, MD 07/09/21 2327

## 2021-07-10 ENCOUNTER — Encounter (HOSPITAL_COMMUNITY)
Admission: EM | Disposition: A | Discharge status: Home Health | Payer: Self-pay | Source: Home / Self Care | Attending: Student

## 2021-07-10 ENCOUNTER — Inpatient Hospital Stay (HOSPITAL_COMMUNITY): Payer: Medicare HMO | Admitting: Anesthesiology

## 2021-07-10 ENCOUNTER — Inpatient Hospital Stay (HOSPITAL_COMMUNITY): Payer: Medicare HMO

## 2021-07-10 ENCOUNTER — Inpatient Hospital Stay: Admit: 2021-07-10 | Payer: Medicare HMO | Admitting: Orthopedic Surgery

## 2021-07-10 ENCOUNTER — Encounter (HOSPITAL_COMMUNITY): Payer: Self-pay | Admitting: Internal Medicine

## 2021-07-10 ENCOUNTER — Other Ambulatory Visit: Payer: Self-pay

## 2021-07-10 DIAGNOSIS — E273 Drug-induced adrenocortical insufficiency: Secondary | ICD-10-CM | POA: Diagnosis not present

## 2021-07-10 DIAGNOSIS — G4733 Obstructive sleep apnea (adult) (pediatric): Secondary | ICD-10-CM

## 2021-07-10 DIAGNOSIS — D638 Anemia in other chronic diseases classified elsewhere: Secondary | ICD-10-CM | POA: Diagnosis not present

## 2021-07-10 DIAGNOSIS — E785 Hyperlipidemia, unspecified: Secondary | ICD-10-CM

## 2021-07-10 DIAGNOSIS — S75001A Unspecified injury of femoral artery, right leg, initial encounter: Secondary | ICD-10-CM

## 2021-07-10 DIAGNOSIS — I2510 Atherosclerotic heart disease of native coronary artery without angina pectoris: Secondary | ICD-10-CM

## 2021-07-10 DIAGNOSIS — W19XXXA Unspecified fall, initial encounter: Secondary | ICD-10-CM | POA: Diagnosis not present

## 2021-07-10 DIAGNOSIS — E1122 Type 2 diabetes mellitus with diabetic chronic kidney disease: Secondary | ICD-10-CM

## 2021-07-10 DIAGNOSIS — G8929 Other chronic pain: Secondary | ICD-10-CM

## 2021-07-10 DIAGNOSIS — S72001A Fracture of unspecified part of neck of right femur, initial encounter for closed fracture: Secondary | ICD-10-CM | POA: Diagnosis not present

## 2021-07-10 DIAGNOSIS — I129 Hypertensive chronic kidney disease with stage 1 through stage 4 chronic kidney disease, or unspecified chronic kidney disease: Secondary | ICD-10-CM | POA: Diagnosis not present

## 2021-07-10 DIAGNOSIS — D696 Thrombocytopenia, unspecified: Secondary | ICD-10-CM

## 2021-07-10 DIAGNOSIS — I152 Hypertension secondary to endocrine disorders: Secondary | ICD-10-CM

## 2021-07-10 DIAGNOSIS — N1831 Chronic kidney disease, stage 3a: Secondary | ICD-10-CM

## 2021-07-10 DIAGNOSIS — E1159 Type 2 diabetes mellitus with other circulatory complications: Secondary | ICD-10-CM

## 2021-07-10 DIAGNOSIS — C689 Malignant neoplasm of urinary organ, unspecified: Secondary | ICD-10-CM

## 2021-07-10 DIAGNOSIS — E1169 Type 2 diabetes mellitus with other specified complication: Secondary | ICD-10-CM

## 2021-07-10 HISTORY — PX: TOTAL HIP ARTHROPLASTY: SHX124

## 2021-07-10 LAB — SURGICAL PCR SCREEN
MRSA, PCR: POSITIVE — AB
Staphylococcus aureus: POSITIVE — AB

## 2021-07-10 LAB — TYPE AND SCREEN
ABO/RH(D): B NEG
Antibody Screen: NEGATIVE

## 2021-07-10 LAB — CBC
HCT: 34.4 % — ABNORMAL LOW (ref 39.0–52.0)
Hemoglobin: 11.5 g/dL — ABNORMAL LOW (ref 13.0–17.0)
MCH: 28.5 pg (ref 26.0–34.0)
MCHC: 33.4 g/dL (ref 30.0–36.0)
MCV: 85.4 fL (ref 80.0–100.0)
Platelets: 139 10*3/uL — ABNORMAL LOW (ref 150–400)
RBC: 4.03 MIL/uL — ABNORMAL LOW (ref 4.22–5.81)
RDW: 13.3 % (ref 11.5–15.5)
WBC: 7.5 10*3/uL (ref 4.0–10.5)
nRBC: 0 % (ref 0.0–0.2)

## 2021-07-10 LAB — BASIC METABOLIC PANEL
Anion gap: 10 (ref 5–15)
BUN: 22 mg/dL (ref 8–23)
CO2: 25 mmol/L (ref 22–32)
Calcium: 8.8 mg/dL — ABNORMAL LOW (ref 8.9–10.3)
Chloride: 102 mmol/L (ref 98–111)
Creatinine, Ser: 1.42 mg/dL — ABNORMAL HIGH (ref 0.61–1.24)
GFR, Estimated: 53 mL/min — ABNORMAL LOW (ref >60)
Glucose, Bld: 102 mg/dL — ABNORMAL HIGH (ref 70–99)
Potassium: 3.7 mmol/L (ref 3.5–5.1)
Sodium: 137 mmol/L (ref 135–145)

## 2021-07-10 LAB — ABO/RH: ABO/RH(D): B NEG

## 2021-07-10 LAB — GLUCOSE, CAPILLARY: Glucose-Capillary: 121 mg/dL — ABNORMAL HIGH (ref 70–99)

## 2021-07-10 LAB — SARS CORONAVIRUS 2 BY RT PCR: SARS Coronavirus 2 by RT PCR: NEGATIVE

## 2021-07-10 SURGERY — ARTHROPLASTY, HIP, TOTAL,POSTERIOR APPROACH
Anesthesia: Regional | Site: Hip | Laterality: Right

## 2021-07-10 MED ORDER — DEXAMETHASONE SODIUM PHOSPHATE 10 MG/ML IJ SOLN
INTRAMUSCULAR | Status: DC | PRN
  Administered 2021-07-10: 10 mg via INTRAVENOUS

## 2021-07-10 MED ORDER — CEFAZOLIN SODIUM-DEXTROSE 2-4 GM/100ML-% IV SOLN
2.0000 g | INTRAVENOUS | Status: AC
Start: 2021-07-10 — End: 2021-07-10
  Administered 2021-07-10: 2 g via INTRAVENOUS

## 2021-07-10 MED ORDER — METHYLPREDNISOLONE SODIUM SUCC 40 MG IJ SOLR: 25.0000 mg | INTRAMUSCULAR | Status: DC

## 2021-07-10 MED ORDER — ROCURONIUM BROMIDE 10 MG/ML (PF) SYRINGE
PREFILLED_SYRINGE | INTRAVENOUS | Status: DC | PRN
  Administered 2021-07-10: 20 mg via INTRAVENOUS
  Administered 2021-07-10: 60 mg via INTRAVENOUS

## 2021-07-10 MED ORDER — METOCLOPRAMIDE HCL 5 MG/ML IJ SOLN: 5.0000 mg | Freq: Three times a day (TID) | INTRAMUSCULAR | Status: DC | PRN

## 2021-07-10 MED ORDER — ROCURONIUM BROMIDE 10 MG/ML (PF) SYRINGE
PREFILLED_SYRINGE | INTRAVENOUS | Status: AC
  Filled 2021-07-10: qty 10

## 2021-07-10 MED ORDER — ACETAMINOPHEN 500 MG PO TABS
1000.0000 mg | ORAL_TABLET | Freq: Four times a day (QID) | ORAL | Status: AC
  Administered 2021-07-10 – 2021-07-11 (×4): 1000 mg via ORAL
  Filled 2021-07-10 (×4): qty 2

## 2021-07-10 MED ORDER — METOCLOPRAMIDE HCL 5 MG PO TABS: 5.0000 mg | ORAL_TABLET | Freq: Three times a day (TID) | ORAL | Status: DC | PRN

## 2021-07-10 MED ORDER — HYDROCORTISONE 5 MG PO TABS
5.0000 mg | ORAL_TABLET | Freq: Every day | ORAL | Status: DC
Start: 2021-07-11 — End: 2021-07-10

## 2021-07-10 MED ORDER — ONDANSETRON HCL 4 MG/2ML IJ SOLN
INTRAMUSCULAR | Status: DC | PRN
  Administered 2021-07-10: 4 mg via INTRAVENOUS

## 2021-07-10 MED ORDER — ACETAMINOPHEN 10 MG/ML IV SOLN
INTRAVENOUS | Status: DC | PRN
  Administered 2021-07-10: 1000 mg via INTRAVENOUS

## 2021-07-10 MED ORDER — SODIUM CHLORIDE 0.9 % IV SOLN: INTRAVENOUS | Status: DC

## 2021-07-10 MED ORDER — FENTANYL CITRATE (PF) 250 MCG/5ML IJ SOLN
INTRAMUSCULAR | Status: DC | PRN
  Administered 2021-07-10 (×2): 50 ug via INTRAVENOUS
  Administered 2021-07-10: 25 ug via INTRAVENOUS
  Administered 2021-07-10: 50 ug via INTRAVENOUS
  Administered 2021-07-10: 25 ug via INTRAVENOUS

## 2021-07-10 MED ORDER — ASPIRIN 81 MG PO TBEC: 81.0000 mg | DELAYED_RELEASE_TABLET | Freq: Two times a day (BID) | ORAL | 0 refills | Status: AC

## 2021-07-10 MED ORDER — ONDANSETRON HCL 4 MG/2ML IJ SOLN
INTRAMUSCULAR | Status: AC
  Filled 2021-07-10: qty 2

## 2021-07-10 MED ORDER — BUPIVACAINE-EPINEPHRINE 0.5% -1:200000 IJ SOLN
INTRAMUSCULAR | Status: DC | PRN
  Administered 2021-07-10: 20 mL

## 2021-07-10 MED ORDER — ALBUMIN HUMAN 5 % IV SOLN: INTRAVENOUS | Status: DC | PRN

## 2021-07-10 MED ORDER — ATORVASTATIN CALCIUM 40 MG PO TABS
40.0000 mg | ORAL_TABLET | Freq: Every day | ORAL | Status: DC
  Administered 2021-07-10 – 2021-07-11 (×2): 40 mg via ORAL
  Filled 2021-07-10 (×2): qty 1

## 2021-07-10 MED ORDER — BUPIVACAINE LIPOSOME 1.3 % IJ SUSP
INTRAMUSCULAR | Status: AC
  Filled 2021-07-10: qty 20

## 2021-07-10 MED ORDER — MUPIROCIN 2 % EX OINT
1.0000 "application " | TOPICAL_OINTMENT | Freq: Once | CUTANEOUS | Status: AC
  Administered 2021-07-10: 1 via TOPICAL
  Filled 2021-07-10: qty 22

## 2021-07-10 MED ORDER — PROPOFOL 10 MG/ML IV BOLUS
INTRAVENOUS | Status: AC
  Filled 2021-07-10: qty 20

## 2021-07-10 MED ORDER — DEXAMETHASONE SODIUM PHOSPHATE 10 MG/ML IJ SOLN
INTRAMUSCULAR | Status: AC
  Filled 2021-07-10: qty 1

## 2021-07-10 MED ORDER — TAMSULOSIN HCL 0.4 MG PO CAPS
0.4000 mg | ORAL_CAPSULE | Freq: Every day | ORAL | Status: DC
  Administered 2021-07-10 – 2021-07-11 (×2): 0.4 mg via ORAL
  Filled 2021-07-10 (×2): qty 1

## 2021-07-10 MED ORDER — PANTOPRAZOLE SODIUM 40 MG PO TBEC
40.0000 mg | DELAYED_RELEASE_TABLET | Freq: Every day | ORAL | Status: DC
  Administered 2021-07-10 – 2021-07-11 (×2): 40 mg via ORAL
  Filled 2021-07-10 (×2): qty 1

## 2021-07-10 MED ORDER — ONDANSETRON HCL 4 MG/2ML IJ SOLN
4.0000 mg | Freq: Four times a day (QID) | INTRAMUSCULAR | Status: DC | PRN
  Filled 2021-07-10: qty 2

## 2021-07-10 MED ORDER — HYDROMORPHONE HCL 1 MG/ML IJ SOLN
0.5000 mg | INTRAMUSCULAR | Status: DC | PRN
  Administered 2021-07-10 – 2021-07-12 (×3): 1 mg via INTRAVENOUS
  Filled 2021-07-10 (×3): qty 1

## 2021-07-10 MED ORDER — AMLODIPINE BESYLATE 5 MG PO TABS
5.0000 mg | ORAL_TABLET | Freq: Every day | ORAL | Status: DC
  Administered 2021-07-10 – 2021-07-11 (×2): 5 mg via ORAL
  Filled 2021-07-10 (×2): qty 1

## 2021-07-10 MED ORDER — SODIUM CHLORIDE 0.9 % IR SOLN
Status: DC | PRN
  Administered 2021-07-10: 1000 mL

## 2021-07-10 MED ORDER — ACETAMINOPHEN 325 MG PO TABS: 325.0000 mg | ORAL_TABLET | Freq: Four times a day (QID) | ORAL | Status: DC | PRN

## 2021-07-10 MED ORDER — CEFAZOLIN SODIUM-DEXTROSE 1-4 GM/50ML-% IV SOLN
1.0000 g | Freq: Four times a day (QID) | INTRAVENOUS | Status: AC
  Administered 2021-07-10 (×2): 1 g via INTRAVENOUS
  Filled 2021-07-10 (×2): qty 50

## 2021-07-10 MED ORDER — LACTATED RINGERS IV SOLN: INTRAVENOUS | Status: DC | PRN

## 2021-07-10 MED ORDER — FENTANYL CITRATE (PF) 100 MCG/2ML IJ SOLN: 25.0000 ug | INTRAMUSCULAR | Status: DC | PRN

## 2021-07-10 MED ORDER — GABAPENTIN 400 MG PO CAPS
800.0000 mg | ORAL_CAPSULE | Freq: Four times a day (QID) | ORAL | Status: DC
  Administered 2021-07-10 – 2021-07-12 (×7): 800 mg via ORAL
  Filled 2021-07-10 (×7): qty 2

## 2021-07-10 MED ORDER — BUPIVACAINE-EPINEPHRINE 0.5% -1:200000 IJ SOLN
INTRAMUSCULAR | Status: AC
  Filled 2021-07-10: qty 1

## 2021-07-10 MED ORDER — LIDOCAINE 2% (20 MG/ML) 5 ML SYRINGE
INTRAMUSCULAR | Status: AC
  Filled 2021-07-10: qty 5

## 2021-07-10 MED ORDER — SORBITOL 70 % SOLN: 30.0000 mL | Freq: Every day | Status: DC | PRN

## 2021-07-10 MED ORDER — ACETAMINOPHEN 500 MG PO TABS: 1000.0000 mg | ORAL_TABLET | Freq: Once | ORAL | Status: DC

## 2021-07-10 MED ORDER — EPHEDRINE 5 MG/ML INJ
INTRAVENOUS | Status: AC
  Filled 2021-07-10: qty 5

## 2021-07-10 MED ORDER — CELECOXIB 200 MG PO CAPS
ORAL_CAPSULE | ORAL | Status: AC
  Filled 2021-07-10: qty 1

## 2021-07-10 MED ORDER — MIDAZOLAM HCL 2 MG/2ML IJ SOLN
INTRAMUSCULAR | Status: DC | PRN
  Administered 2021-07-10: 1 mg via INTRAVENOUS

## 2021-07-10 MED ORDER — HYDROMORPHONE HCL 1 MG/ML IJ SOLN
0.5000 mg | INTRAMUSCULAR | Status: DC | PRN
  Administered 2021-07-10 (×2): 0.5 mg via INTRAVENOUS
  Filled 2021-07-10 (×2): qty 1

## 2021-07-10 MED ORDER — TRANEXAMIC ACID 1000 MG/10ML IV SOLN
INTRAVENOUS | Status: DC | PRN
  Administered 2021-07-10: 2000 mg via TOPICAL

## 2021-07-10 MED ORDER — ASPIRIN 81 MG PO CHEW
81.0000 mg | CHEWABLE_TABLET | Freq: Two times a day (BID) | ORAL | Status: DC
  Administered 2021-07-11 – 2021-07-12 (×3): 81 mg via ORAL
  Filled 2021-07-10 (×3): qty 1

## 2021-07-10 MED ORDER — PHENYLEPHRINE 80 MCG/ML (10ML) SYRINGE FOR IV PUSH (FOR BLOOD PRESSURE SUPPORT)
PREFILLED_SYRINGE | INTRAVENOUS | Status: AC
  Filled 2021-07-10: qty 10

## 2021-07-10 MED ORDER — CEFAZOLIN SODIUM-DEXTROSE 2-4 GM/100ML-% IV SOLN
INTRAVENOUS | Status: AC
  Filled 2021-07-10: qty 100

## 2021-07-10 MED ORDER — TRANEXAMIC ACID-NACL 1000-0.7 MG/100ML-% IV SOLN
INTRAVENOUS | Status: AC
  Filled 2021-07-10: qty 100

## 2021-07-10 MED ORDER — PHENYLEPHRINE HCL-NACL 20-0.9 MG/250ML-% IV SOLN
INTRAVENOUS | Status: DC | PRN
  Administered 2021-07-10: 20 ug/min via INTRAVENOUS

## 2021-07-10 MED ORDER — TRANEXAMIC ACID 1000 MG/10ML IV SOLN
2000.0000 mg | Freq: Once | INTRAVENOUS | Status: DC
  Filled 2021-07-10: qty 20

## 2021-07-10 MED ORDER — LIDOCAINE 2% (20 MG/ML) 5 ML SYRINGE
INTRAMUSCULAR | Status: DC | PRN
  Administered 2021-07-10: 60 mg via INTRAVENOUS

## 2021-07-10 MED ORDER — ONDANSETRON HCL 4 MG PO TABS
4.0000 mg | ORAL_TABLET | Freq: Four times a day (QID) | ORAL | Status: DC | PRN
  Administered 2021-07-12: 4 mg via ORAL
  Filled 2021-07-10: qty 1

## 2021-07-10 MED ORDER — DIPHENHYDRAMINE HCL 12.5 MG/5ML PO ELIX: 12.5000 mg | ORAL_SOLUTION | ORAL | Status: DC | PRN

## 2021-07-10 MED ORDER — HYDROCORTISONE 20 MG PO TABS
20.0000 mg | ORAL_TABLET | Freq: Every day | ORAL | Status: DC
  Administered 2021-07-11 – 2021-07-12 (×2): 20 mg via ORAL
  Filled 2021-07-10 (×3): qty 1

## 2021-07-10 MED ORDER — TRANEXAMIC ACID-NACL 1000-0.7 MG/100ML-% IV SOLN
1000.0000 mg | Freq: Once | INTRAVENOUS | Status: AC
  Administered 2021-07-10: 1000 mg via INTRAVENOUS
  Filled 2021-07-10: qty 100

## 2021-07-10 MED ORDER — ACETAMINOPHEN 10 MG/ML IV SOLN
INTRAVENOUS | Status: AC
  Filled 2021-07-10: qty 100

## 2021-07-10 MED ORDER — FLEET ENEMA 7-19 GM/118ML RE ENEM: 1.0000 | ENEMA | Freq: Once | RECTAL | Status: DC | PRN

## 2021-07-10 MED ORDER — TRANEXAMIC ACID-NACL 1000-0.7 MG/100ML-% IV SOLN
1000.0000 mg | INTRAVENOUS | Status: AC
  Administered 2021-07-10: 1000 mg via INTRAVENOUS
  Filled 2021-07-10: qty 100

## 2021-07-10 MED ORDER — DOCUSATE SODIUM 100 MG PO CAPS
100.0000 mg | ORAL_CAPSULE | Freq: Two times a day (BID) | ORAL | Status: DC
  Administered 2021-07-10 (×2): 100 mg via ORAL
  Filled 2021-07-10 (×2): qty 1

## 2021-07-10 MED ORDER — BUPIVACAINE LIPOSOME 1.3 % IJ SUSP
INTRAMUSCULAR | Status: DC | PRN
  Administered 2021-07-10: 20 mL

## 2021-07-10 MED ORDER — EPHEDRINE SULFATE-NACL 50-0.9 MG/10ML-% IV SOSY
PREFILLED_SYRINGE | INTRAVENOUS | Status: DC | PRN
  Administered 2021-07-10 (×2): 2.5 mg via INTRAVENOUS

## 2021-07-10 MED ORDER — OXYCODONE HCL 5 MG PO TABS
5.0000 mg | ORAL_TABLET | ORAL | Status: DC | PRN
  Administered 2021-07-10: 10 mg via ORAL
  Filled 2021-07-10: qty 2

## 2021-07-10 MED ORDER — SENNOSIDES-DOCUSATE SODIUM 8.6-50 MG PO TABS: 1.0000 | ORAL_TABLET | Freq: Every evening | ORAL | Status: DC | PRN

## 2021-07-10 MED ORDER — EZETIMIBE 10 MG PO TABS
10.0000 mg | ORAL_TABLET | Freq: Every day | ORAL | Status: DC
  Administered 2021-07-10 – 2021-07-11 (×2): 10 mg via ORAL
  Filled 2021-07-10 (×2): qty 1

## 2021-07-10 MED ORDER — CHLORHEXIDINE GLUCONATE 0.12 % MT SOLN
OROMUCOSAL | Status: AC
  Filled 2021-07-10: qty 15

## 2021-07-10 MED ORDER — HYDROCODONE-ACETAMINOPHEN 5-325 MG PO TABS: 1.0000 | ORAL_TABLET | Freq: Four times a day (QID) | ORAL | Status: DC | PRN

## 2021-07-10 MED ORDER — MIDAZOLAM HCL 2 MG/2ML IJ SOLN
INTRAMUSCULAR | Status: AC
Start: 2021-07-10 — End: ?
  Filled 2021-07-10: qty 2

## 2021-07-10 MED ORDER — SUGAMMADEX SODIUM 200 MG/2ML IV SOLN
INTRAVENOUS | Status: DC | PRN
  Administered 2021-07-10: 200 mg via INTRAVENOUS

## 2021-07-10 MED ORDER — HYDROCORTISONE 5 MG PO TABS
5.0000 mg | ORAL_TABLET | Freq: Every day | ORAL | Status: DC
  Administered 2021-07-11: 5 mg via ORAL
  Filled 2021-07-10 (×3): qty 1

## 2021-07-10 MED ORDER — ROPIVACAINE HCL 5 MG/ML IJ SOLN
INTRAMUSCULAR | Status: DC | PRN
  Administered 2021-07-10: 30 mL via PERINEURAL

## 2021-07-10 MED ORDER — PHENOL 1.4 % MT LIQD: 1.0000 | OROMUCOSAL | Status: DC | PRN

## 2021-07-10 MED ORDER — ROPINIROLE HCL ER 2 MG PO TB24: 2.0000 mg | ORAL_TABLET | Freq: Every day | ORAL | Status: DC

## 2021-07-10 MED ORDER — HYDROCODONE-ACETAMINOPHEN 10-325 MG PO TABS: 1.0000 | ORAL_TABLET | ORAL | 0 refills | Status: AC | PRN

## 2021-07-10 MED ORDER — ACETAMINOPHEN 500 MG PO TABS
ORAL_TABLET | ORAL | Status: AC
  Filled 2021-07-10: qty 2

## 2021-07-10 MED ORDER — PROPOFOL 10 MG/ML IV BOLUS
INTRAVENOUS | Status: DC | PRN
  Administered 2021-07-10: 130 mg via INTRAVENOUS

## 2021-07-10 MED ORDER — POLYETHYLENE GLYCOL 3350 17 G PO PACK: 17.0000 g | PACK | Freq: Every day | ORAL | Status: DC | PRN

## 2021-07-10 MED ORDER — CELECOXIB 200 MG PO CAPS: 200.0000 mg | ORAL_CAPSULE | Freq: Once | ORAL | Status: DC

## 2021-07-10 MED ORDER — PHENYLEPHRINE 80 MCG/ML (10ML) SYRINGE FOR IV PUSH (FOR BLOOD PRESSURE SUPPORT)
PREFILLED_SYRINGE | INTRAVENOUS | Status: DC | PRN
  Administered 2021-07-10 (×7): 80 ug via INTRAVENOUS

## 2021-07-10 MED ORDER — FENTANYL CITRATE (PF) 250 MCG/5ML IJ SOLN
INTRAMUSCULAR | Status: AC
  Filled 2021-07-10: qty 5

## 2021-07-10 MED ORDER — OXYCODONE-ACETAMINOPHEN 5-325 MG PO TABS
1.0000 | ORAL_TABLET | Freq: Four times a day (QID) | ORAL | Status: DC | PRN
  Administered 2021-07-10: 1 via ORAL
  Administered 2021-07-10 – 2021-07-11 (×3): 2 via ORAL
  Administered 2021-07-11 – 2021-07-12 (×2): 1 via ORAL
  Filled 2021-07-10: qty 1
  Filled 2021-07-10 (×3): qty 2
  Filled 2021-07-10: qty 1
  Filled 2021-07-10: qty 2

## 2021-07-10 MED ORDER — MENTHOL 3 MG MT LOZG: 1.0000 | LOZENGE | OROMUCOSAL | Status: DC | PRN

## 2021-07-10 SURGICAL SUPPLY — 66 items
BAG COUNTER SPONGE SURGICOUNT (BAG) ×2 IMPLANT
BAG DECANTER FOR FLEXI CONT (MISCELLANEOUS) ×1 IMPLANT
BENZOIN TINCTURE PRP APPL 2/3 (GAUZE/BANDAGES/DRESSINGS) ×1 IMPLANT
BLADE SAW SGTL 73X25 THK (BLADE) ×2 IMPLANT
BRUSH FEMORAL CANAL (MISCELLANEOUS) IMPLANT
CLSR STERI-STRIP ANTIMIC 1/2X4 (GAUZE/BANDAGES/DRESSINGS) ×1 IMPLANT
COVER SURGICAL LIGHT HANDLE (MISCELLANEOUS) ×2 IMPLANT
DRAPE INCISE IOBAN 66X45 STRL (DRAPES) IMPLANT
DRAPE ORTHO SPLIT 77X108 STRL (DRAPES) ×2
DRAPE SURG ORHT 6 SPLT 77X108 (DRAPES) ×2 IMPLANT
DRAPE U-SHAPE 47X51 STRL (DRAPES) ×2 IMPLANT
DRSG ADAPTIC 3X8 NADH LF (GAUZE/BANDAGES/DRESSINGS) ×2 IMPLANT
DRSG AQUACEL AG ADV 3.5X10 (GAUZE/BANDAGES/DRESSINGS) ×1 IMPLANT
DRSG PAD ABDOMINAL 8X10 ST (GAUZE/BANDAGES/DRESSINGS) ×4 IMPLANT
DURAPREP 26ML APPLICATOR (WOUND CARE) ×2 IMPLANT
ELECT BLADE 6.5 EXT (BLADE) IMPLANT
ELECT CAUTERY BLADE 6.4 (BLADE) ×2 IMPLANT
ELECT REM PT RETURN 9FT ADLT (ELECTROSURGICAL) ×2
ELECTRODE REM PT RTRN 9FT ADLT (ELECTROSURGICAL) ×1 IMPLANT
FACESHIELD WRAPAROUND (MASK) ×4 IMPLANT
FACESHIELD WRAPAROUND OR TEAM (MASK) ×2 IMPLANT
GAUZE PAD ABD 8X10 STRL (GAUZE/BANDAGES/DRESSINGS) ×1 IMPLANT
GAUZE SPONGE 4X4 12PLY STRL (GAUZE/BANDAGES/DRESSINGS) ×2 IMPLANT
GAUZE SPONGE 4X4 12PLY STRL LF (GAUZE/BANDAGES/DRESSINGS) ×1 IMPLANT
GLOVE BIOGEL PI IND STRL 8 (GLOVE) ×2 IMPLANT
GLOVE BIOGEL PI INDICATOR 8 (GLOVE) ×2
GLOVE ORTHO TXT STRL SZ7.5 (GLOVE) ×4 IMPLANT
GLOVE SURG ORTHO 8.0 STRL STRW (GLOVE) ×4 IMPLANT
GOWN STRL REUS W/ TWL LRG LVL3 (GOWN DISPOSABLE) ×1 IMPLANT
GOWN STRL REUS W/ TWL XL LVL3 (GOWN DISPOSABLE) ×1 IMPLANT
GOWN STRL REUS W/TWL 2XL LVL3 (GOWN DISPOSABLE) ×2 IMPLANT
GOWN STRL REUS W/TWL LRG LVL3 (GOWN DISPOSABLE) ×1
GOWN STRL REUS W/TWL XL LVL3 (GOWN DISPOSABLE) ×1
HANDPIECE INTERPULSE COAX TIP (DISPOSABLE)
HEAD FEM UNIPOLAR 52 OD STRL (Hips) ×1 IMPLANT
HOOD PEEL AWAY FACE SHEILD DIS (HOOD) ×2 IMPLANT
IMMOBILIZER KNEE 22 UNIV (SOFTGOODS) ×2 IMPLANT
KIT BASIN OR (CUSTOM PROCEDURE TRAY) ×2 IMPLANT
KIT TURNOVER KIT B (KITS) ×2 IMPLANT
MANIFOLD NEPTUNE II (INSTRUMENTS) ×2 IMPLANT
NDL MAYO TROCAR (NEEDLE) IMPLANT
NEEDLE 22X1 1/2 (OR ONLY) (NEEDLE) ×4 IMPLANT
NEEDLE MAYO TROCAR (NEEDLE) IMPLANT
NS IRRIG 1000ML POUR BTL (IV SOLUTION) ×2 IMPLANT
PACK TOTAL JOINT (CUSTOM PROCEDURE TRAY) ×2 IMPLANT
PACK UNIVERSAL I (CUSTOM PROCEDURE TRAY) ×2 IMPLANT
PAD ARMBOARD 7.5X6 YLW CONV (MISCELLANEOUS) ×4 IMPLANT
PRESSURIZER FEMORAL UNIV (MISCELLANEOUS) IMPLANT
SET HNDPC FAN SPRY TIP SCT (DISPOSABLE) IMPLANT
SPACER DEPUY (Hips) ×1 IMPLANT
STAPLER VISISTAT 35W (STAPLE) ×2 IMPLANT
STEM FEMORAL SZ8 STD ACTIS (Stem) ×1 IMPLANT
SUCTION FRAZIER HANDLE 10FR (MISCELLANEOUS) ×1
SUCTION TUBE FRAZIER 10FR DISP (MISCELLANEOUS) ×1 IMPLANT
SUT ETHIBOND 2 V 37 (SUTURE) ×2 IMPLANT
SUT VIC AB 0 CT1 27 (SUTURE) ×1
SUT VIC AB 0 CT1 27XBRD ANBCTR (SUTURE) ×1 IMPLANT
SUT VIC AB 2-0 CT1 27 (SUTURE) ×2
SUT VIC AB 2-0 CT1 TAPERPNT 27 (SUTURE) ×2 IMPLANT
SYR CONTROL 10ML LL (SYRINGE) ×4 IMPLANT
TAPE CLOTH SURG 6X10 WHT LF (GAUZE/BANDAGES/DRESSINGS) ×1 IMPLANT
TOWEL GREEN STERILE (TOWEL DISPOSABLE) ×2 IMPLANT
TOWEL GREEN STERILE FF (TOWEL DISPOSABLE) ×2 IMPLANT
TOWER CARTRIDGE SMART MIX (DISPOSABLE) IMPLANT
TRAY CATH 16FR W/PLASTIC CATH (SET/KITS/TRAYS/PACK) IMPLANT
WATER STERILE IRR 1000ML POUR (IV SOLUTION) ×2 IMPLANT

## 2021-07-10 NOTE — Discharge Instructions (Signed)

## 2021-07-10 NOTE — Assessment & Plan Note (Signed)
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  Following with [facility] heme-onc Dr. Alverda Skeans.

## 2021-07-10 NOTE — Hospital Course (Addendum)
Ian Gray is a 71 y.o. 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

## 2021-07-10 NOTE — Op Note (Signed)
Ian Gray, HEDGLIN MEDICAL RECORD NO: [id number] ACCOUNT NO: [id number] DATE OF BIRTH: 26-Jun-1950 FACILITY: [facility] LOCATION: [facility] PHYSICIAN: W D. Valeta Harms., MD  Operative Report   DATE OF PROCEDURE: 07/10/2021  PREOPERATIVE DIAGNOSIS:  Right femoral neck fracture.  POSTOPERATIVE DIAGNOSIS:  Right femoral neck fracture.  PROCEDURE:  Right hip unipolar hemiarthroplasty (DePuy ACTIS stem, -3 mm neck length with 52 mm hip ball, size 8 femoral stem).  SURGEON:  W D. Valeta Harms., MD  ASSISTANTMarjo Bicker, PA  ANESTHESIA:  General with block.  BLOOD LOSS:  200.  DESCRIPTION OF PROCEDURE:  Lateral position with a posterior approach to the hip.  We split the iliotibial band and gluteus maximus fascia, did a T-capsulotomy in the hip, revealing the femoral neck fracture.  The head was removed and sent to pathology  due to the patient's history of cancer.  This did not appear to be due to that, however. Did a provisional neck cut about one fingerbreadth above the lesser trochanter.  We then used the broaches to accept a #8 ACTIS broach.  Acetabulum was sized to be  52.  We trialed with the -3 *** standard neck length and the -3 provided good stability with no tendency to dislocate, except with extreme flexion, internal rotation, and adduction.  Final prosthesis was placed followed by provisional trial again.   Again, the above-mentioned sizes were used.  Wound was copiously irrigated.  Topical TXA was used as well as Exparel and closed the T-capsulotomy with #1 Ethibond.  The fascia with #1 Ethibond.  The subcutaneous tissues with Vicryl and the skin with  Monocryl.  Taken to recovery room in stable condition.   SHW D: 07/10/2021 9:30:06 am T: 07/10/2021 10:06:00 pm  JOB: [id number]/ [id number]

## 2021-07-10 NOTE — Anesthesia Procedure Notes (Addendum)
Anesthesia Regional Block: Peng block   Pre-Anesthetic Checklist: , timeout performed,  Correct Patient, Correct Site, Correct Laterality,  Correct Procedure, Correct Position, site marked,  Risks and benefits discussed,  Surgical consent,  Pre-op evaluation,  At surgeon's request and post-op pain management  Laterality: Right  Prep: Dura Prep       Needles:  Injection technique: Single-shot  Needle Type: Echogenic Stimulator Needle     Needle Length: 10cm  Needle Gauge: 20     Additional Needles:   Procedures:,,,, ultrasound used (permanent image in chart),,    Narrative:  Start time: 07/10/2021 7:20 AM End time: 07/10/2021 7:25 AM Injection made incrementally with aspirations every 5 mL.  Performed by: Personally  Anesthesiologist: Darral Dash, DO  Additional Notes: Patient identified. Risks/Benefits/Options discussed with patient including but not limited to bleeding, infection, nerve damage, failed block, incomplete pain control. Patient expressed understanding and wished to proceed. All questions were answered. Sterile technique was used throughout the entire procedure. Please see nursing notes for vital signs. Aspirated in 5cc intervals with injection for negative confirmation. Patient was given instructions on fall risk and not to get out of bed. All questions and concerns addressed with instructions to call with any issues or inadequate analgesia.

## 2021-07-10 NOTE — Assessment & Plan Note (Signed)
Continue home amlodipine 5 mg daily.

## 2021-07-10 NOTE — Brief Op Note (Signed)
07/09/2021 - 07/10/2021  9:58 AM  PATIENT:  Ian Gray  71 y.o. male  PRE-OPERATIVE DIAGNOSIS:  Right Hip Facture  POST-OPERATIVE DIAGNOSIS:  Right Hip Facture  PROCEDURE:  Procedure(s): HIP HEMIARTHROPLASTY (Right)  SURGEON:  Surgeon(s) and Role:    [name], MD - Primary  PHYSICIAN ASSISTANT: Chriss Czar, PA-C  ASSISTANTS:    ANESTHESIA:   local, regional, and general  EBL:  300 mL   BLOOD ADMINISTERED:none  DRAINS: none   LOCAL MEDICATIONS USED:  MARCAINE     SPECIMEN:  Source of Specimen:  R femoral head  DISPOSITION OF SPECIMEN:  PATHOLOGY  COUNTS:  YES  TOURNIQUET:  * No tourniquets in log *  DICTATION: .Other Dictation: Dictation Number unknown  PLAN OF CARE:  has inpt order  PATIENT DISPOSITION:  PACU - hemodynamically stable.   Delay start of Pharmacological VTE agent (>24hrs) due to surgical blood loss or risk of bleeding: yes

## 2021-07-10 NOTE — Assessment & Plan Note (Signed)
Renal function at baseline.  Continue to monitor. ?

## 2021-07-10 NOTE — ED Notes (Signed)
The pt is c/o pain his temp is now normal

## 2021-07-10 NOTE — Assessment & Plan Note (Signed)
Reports history of multiple stents, most recently DES to OM 2 and OM 3 in April 2021.  Denies any recent chest pain.  Troponin negative x2. -Holding aspirin tonight -Continue atorvastatin and Zetia

## 2021-07-10 NOTE — Assessment & Plan Note (Signed)
Occurring after mechanical fall.  Orthopedics planning for right hip hemiarthroplasty 6/10 with Dr. French Ana.  Patient has a 6.0% preoperative 30-day risk of death, MI, or cardiac arrest based on RCRI given history of CAD. -Keep n.p.o. -Continue analgesics as needed -Hold aspirin tonight

## 2021-07-10 NOTE — Assessment & Plan Note (Addendum)
Patient with secondary adrenal insufficiency due to immunotherapy.  He takes hydrocortisone 20 mg a.m. and 5 mg p.m. -Continue home dose hydrocortisone 20 mg in the morning and 5 mg p.m. -Discussed with pharmacy, IV hydrocortisone unavailable for perioperative stress dosing.  Instead recommendation is for IV Solu-Medrol 25 mg once preop.

## 2021-07-10 NOTE — Assessment & Plan Note (Signed)
Continue atorvastatin and Zetia. 

## 2021-07-10 NOTE — Anesthesia Postprocedure Evaluation (Signed)
Anesthesia Post Note  Patient: Ian Gray  Procedure(s) Performed: HIP HEMIARTHROPLASTY (Right: Hip)     Patient location during evaluation: PACU Anesthesia Type: Regional and General Level of consciousness: awake and alert Pain management: pain level controlled Vital Signs Assessment: post-procedure vital signs reviewed and stable Respiratory status: spontaneous breathing, nonlabored ventilation, respiratory function stable and patient connected to nasal cannula oxygen Cardiovascular status: blood pressure returned to baseline and stable Postop Assessment: no apparent nausea or vomiting Anesthetic complications: no   No notable events documented.  Last Vitals:  Vitals:   07/10/21 1210 07/10/21 1601  BP: 131/63 125/64  Pulse: 78 82  Resp: 13 17  Temp: 36.6 C 36.7 C  SpO2: 96% 95%    Last Pain:  Vitals:   07/10/21 1601  TempSrc: Oral  PainSc:                  March Rummage Tarryn Bogdan

## 2021-07-10 NOTE — Assessment & Plan Note (Signed)
Not using CPAP at home.

## 2021-07-10 NOTE — Assessment & Plan Note (Signed)
Diet controlled, continue to monitor.

## 2021-07-10 NOTE — Progress Notes (Signed)
PROGRESS NOTE  Ian Gray EHU:[id number] DOB: July 07, 1950   PCP: Kristine Linea, MD  Patient is from: Home.  DOA: 07/09/2021 LOS: 1  Chief complaints Chief Complaint  Patient presents with   hip surgery     Brief Narrative / Interim history: 71 y.o. 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   Patient underwent right hip hemiarthroplasty on 6/10 by Dr. Marjo Bicker.  Subjective: Seen and examined this afternoon after return from surgery.  Patient's son at bedside.  Patient complains chronic lower back pain.  He states p.o. Percocet worked well for him yesterday evening.  He had Dilaudid earlier but it did not last long.  He denies chest pain or dyspnea.  Patient's son asking if his chemotherapy needs to be held since his oncologist previously suggested holding chemotherapy if he had to undergo hernia repair.  Objective: Vitals:   07/10/21 1037 07/10/21 1106 07/10/21 1133 07/10/21 1210  BP: (!) 116/56 (!) 124/59 130/68 131/63  Pulse: 91 78 81 78  Resp: (!) '22 20 16 13  '$ Temp:   (!) 97.4 F (36.3 C) 97.9 F (36.6 C)  TempSrc:    Oral  SpO2: 94% 94% 92% 96%  Weight:      Height:        Examination:  GENERAL: No apparent distress.  Nontoxic. HEENT: MMM.  Vision and hearing grossly intact.  NECK: Supple.  No apparent JVD.  RESP:  No IWOB.  Fair aeration bilaterally. CVS:  RRR. Heart  sounds normal.  ABD/GI/GU: BS+. Abd soft, NTND.  MSK/EXT:  Moves extremities. No apparent deformity. No edema.  SKIN: Dressing over surgical site DCI.  Wiggling his toes. NEURO: Awake, alert and oriented appropriately.  No apparent focal neuro deficit. PSYCH: Calm. Normal affect.   Procedures:  6/10-right hip hemiarthroplasty  Microbiology summarized: None  Assessment and plan: Principal Problem:   Fracture of femoral neck, right, closed ([place]) Active Problems:   Adrenal insufficiency due to cancer therapy ([place])   CAD (coronary artery disease)   Urothelial cancer ([place])   Chronic kidney disease, stage 3a ([place])   Hypertension associated with diabetes ([place])   Type 2 diabetes mellitus ([place])   Hyperlipidemia associated with type 2 diabetes mellitus ([facility])   OSA (obstructive sleep apnea)   Accidental fall   Anemia of chronic disease   Thrombocytopenia ([place])  Accidental fall at home-reportedly tripped and fell backward.  Did not need his head.  No LOC. Closed right femoral neck fracture -S/p right hip hemiarthroplasty by Dr. Marjo Bicker on 6/10. -Scheduled Tylenol and as needed oxycodone and Dilaudid for pain control -Defer VTE prophylaxis to surgery.  SCD for now -Bowel regimen  Secondary adrenal insufficiency (due to immunotherapy on hydrocortisone 20/5) -Received Solu-Medrol 25 mg perioperatively. -Continue home Cortef 20 mg in the morning and 5 mg in the afternoon.  Metastatic urothelial cancer of the left renal pelvis s/p nephrectomy, chemo, currently on erdafitinib.  Bladder (s/p TURBT 03/01/2021) with nodal, osseous, hepatic metastasis,  Lynch syndrome/colon cancer,  small bowel adenocarcinoma s/p resection -Following with [facility] heme-onc Dr. Alverda Skeans. -Hold home erdafitinib  CAD (s/p stenting x5 most recently DES to OM2/OM3 April 2021): Stable.  No cardiopulmonary symptoms. -Continue home meds  CKD-3A: Relatively stable. Recent Labs    07/09/21 1807  07/10/21 0619  BUN 29* 22  CREATININE 1.31* 1.42*  -Continue monitoring  Anemia of chronic disease: H&H relatively stable. Recent Labs    07/09/21 1807 07/10/21 0619  HGB 12.8* 11.5*  -Monitor -Check anemia panel in the morning  Chronic back pain -Pain regimen as above.  NIDDM-2?  Does not seem to be on medication.  No previous A1c in chart.  CBG within acceptable range. -Check hemoglobin A1c   Essential hypertension.  Normotensive. -Continue home amlodipine  Thrombocytopenia: Mild.  Monitor.  OSA not using CPAP  Body mass index is 28.48 kg/m.         DVT prophylaxis:  SCDs Start: 07/10/21 1151 SCDs Start: 07/10/21 0029  Code Status: Full code Family Communication: The patient's son at bedside. Level of care: Telemetry Medical Status is: Inpatient Remains inpatient appropriate because: Right femoral neck fracture   Final disposition: TBD Consultants:  Orthopedic surgery  Sch Meds:  Scheduled Meds:  acetaminophen  1,000 mg Oral Q6H   amLODipine  5 mg Oral QHS   [START ON 07/11/2021] aspirin  81 mg Oral BID   atorvastatin  40 mg Oral QHS   chlorhexidine       docusate sodium  100 mg Oral BID   ezetimibe  10 mg Oral QHS   gabapentin  800 mg Oral QID   hydrocortisone  20 mg Oral Daily   hydrocortisone  5 mg Oral q1600   methylPREDNISolone (SOLU-MEDROL) injection  25 mg Intravenous 60 min Pre-Op   pantoprazole  40 mg Oral QHS   rOPINIRole  2 mg Oral QHS   tamsulosin  0.4 mg Oral QHS   tranexamic acid (CYKLOKAPRON) 2,000 mg in sodium chloride 0.9 % 50 mL Topical Application  9,767 mg Topical Once   Continuous Infusions:  sodium chloride Stopped (07/10/21 0750)   sodium chloride      ceFAZolin (ANCEF) IV 1 g (07/10/21 1459)   PRN Meds:.chlorhexidine, diphenhydrAMINE, HYDROmorphone (DILAUDID) injection, menthol-cetylpyridinium **OR** phenol, metoCLOPramide **OR** metoCLOPramide (REGLAN) injection, ondansetron **OR** ondansetron (ZOFRAN) IV,  oxyCODONE-acetaminophen, polyethylene glycol, senna-docusate, sodium phosphate, sorbitol  Antimicrobials: Anti-infectives (From admission, onward)    Start     Dose/Rate Route Frequency Ordered Stop   07/10/21 1245  ceFAZolin (ANCEF) IVPB 1 g/50 mL premix        1 g 100 mL/hr over 30 Minutes Intravenous Every 6 hours 07/10/21 1150 07/11/21 0044   07/10/21 0730  ceFAZolin (ANCEF) IVPB 2g/100 mL premix        2 g 200 mL/hr over 30 Minutes Intravenous On call to O.R. 07/10/21 3419 07/10/21 0812   07/10/21 0722  ceFAZolin (ANCEF) 2-4 GM/100ML-% IVPB       Note to Pharmacy: Nyoka Cowden D: cabinet override      07/10/21 0722 07/10/21 0821        I have personally reviewed the following labs and images: CBC: Recent Labs  Lab 07/09/21 1807 07/10/21 0619  WBC 9.9 7.5  NEUTROABS 8.7*  --   HGB 12.8* 11.5*  HCT 38.9* 34.4*  MCV 87.8 85.4  PLT 179 139*   BMP &GFR Recent Labs  Lab 07/09/21 1807 07/10/21 0619  NA 137 137  K 4.1 3.7  CL 103 102  CO2 25 25  GLUCOSE 114* 102*  BUN 29* 22  CREATININE 1.31* 1.42*  CALCIUM 9.7 8.8*   Estimated Creatinine Clearance: 56.3 mL/min (A) (by C-G formula based on SCr of 1.42 mg/dL (H)). Liver & Pancreas: Recent Labs  Lab 07/09/21 1807  AST 24  ALT 24  ALKPHOS 63  BILITOT 1.0  PROT 6.8  ALBUMIN 4.0   No results for input(s): "LIPASE", "AMYLASE" in the last 168 hours. No results for input(s): "AMMONIA" in the last 168 hours. Diabetic: No results for input(s): "HGBA1C" in the last 72 hours. Recent Labs  Lab 07/10/21 1010  GLUCAP 121*   Cardiac Enzymes: No results for input(s): "CKTOTAL", "CKMB", "CKMBINDEX", "TROPONINI" in the last 168 hours. No results for input(s): "PROBNP" in the last 8760 hours. Coagulation Profile: Recent Labs  Lab 07/09/21 1807  INR 1.1   Thyroid Function Tests: No results for input(s): "TSH", "T4TOTAL", "FREET4", "T3FREE", "THYROIDAB" in the last 72 hours. Lipid Profile: No results for  input(s): "CHOL", "HDL", "LDLCALC", "TRIG", "CHOLHDL", "LDLDIRECT" in the last 72 hours. Anemia Panel: No results for input(s): "VITAMINB12", "FOLATE", "FERRITIN", "TIBC", "IRON", "RETICCTPCT" in the last 72 hours. Urine analysis: No results found for: "COLORURINE", "APPEARANCEUR", "LABSPEC", "PHURINE", "GLUCOSEU", "HGBUR", "BILIRUBINUR", "KETONESUR", "PROTEINUR", "UROBILINOGEN", "NITRITE", "LEUKOCYTESUR" Sepsis Labs: Invalid input(s): "PROCALCITONIN", "LACTICIDVEN"  Microbiology: Recent Results (from the past 240 hour(s))  SARS Coronavirus 2 by RT PCR (hospital order, performed in [facility] hospital lab) *cepheid single result test* Anterior Nasal Swab     Status: None   Collection Time: 07/09/21 11:51 PM   Specimen: Anterior Nasal Swab  Result Value Ref Range Status   SARS Coronavirus 2 by RT PCR NEGATIVE NEGATIVE Final    Comment: (NOTE) SARS-CoV-2 target nucleic acids are NOT DETECTED.  The SARS-CoV-2 RNA is generally detectable in upper and lower respiratory specimens during the acute phase of infection. The lowest concentration of SARS-CoV-2 viral copies this assay can detect is 250 copies / mL. A negative result does not preclude SARS-CoV-2 infection and should not be used as the sole basis for treatment or other patient management decisions.  A negative result may occur with improper specimen collection / handling, submission of specimen other than nasopharyngeal swab, presence of viral mutation(s) within the areas targeted by this assay, and inadequate number of viral copies (<250 copies / mL). A negative result must be combined with clinical observations, patient history, and epidemiological information.  Fact Sheet for Patients:   https://www.patel.info/  Fact Sheet for Healthcare Providers: https://hall.com/  This test is not yet approved or  cleared by the [place] FDA and has been authorized for detection and/or  diagnosis of SARS-CoV-2 by FDA under an Emergency Use Authorization (EUA).  This EUA will remain in effect (meaning this test can be used) for the duration of the COVID-19 declaration under Section 564(b)(1) of the Act, 21 U.S.C. section 360bbb-3(b)(1), unless the authorization is terminated or revoked sooner.  Performed at [facility], [place] [address], [place], [place] [address]     Radiology Studies: DG Hip Port Unilat With Pelvis 1V Right  Result Date: 07/10/2021 CLINICAL DATA:  Postop right hip arthroplasty. EXAM: DG HIP (WITH OR WITHOUT PELVIS) 1V PORT RIGHT COMPARISON:  07/09/2021. FINDINGS: New right hemiarthroplasty appears well seated and aligned. No acute fracture or evidence an operative complication. Soft tissue edema and air overlies the proximal right femur and right hip. IMPRESSION: 1. Well position and aligned right hip hemiarthroplasty. Electronically Signed   By: Shanon Brow  Ormond M.D.   On: 07/10/2021 11:33   CT Head Wo Contrast  Result Date: 07/09/2021 CLINICAL DATA:  Head trauma, minor (Age >= 65y).  Fall. EXAM: CT HEAD WITHOUT CONTRAST TECHNIQUE: Contiguous axial images were obtained from the base of the skull through the vertex without intravenous contrast. RADIATION DOSE REDUCTION: This exam was performed according to the departmental dose-optimization program which includes automated exposure control, adjustment of the mA and/or kV according to patient size and/or use of iterative reconstruction technique. COMPARISON:  None Available. FINDINGS: Brain: No acute intracranial abnormality. Specifically, no hemorrhage, hydrocephalus, mass lesion, acute infarction, or significant intracranial injury. Vascular: No hyperdense vessel or unexpected calcification. Skull: No acute calvarial abnormality. Sinuses/Orbits: No acute findings Other: None IMPRESSION: No acute intracranial abnormality. Electronically Signed   By: Rolm Baptise M.D.   On: 07/09/2021 19:23   DG Chest Port 1  View  Result Date: 07/09/2021 CLINICAL DATA:  [id number].  Preop EXAM: PORTABLE CHEST 1 VIEW COMPARISON:  Chest x-ray 05/16/2019, CT chest 01/31/2019 FINDINGS: Right chest wall Port-A-Cath with tip overlying the right atrium. The heart and mediastinal contours are unchanged. Atherosclerotic plaque. Coronary artery calcification. No focal consolidation. No pulmonary edema. No pleural effusion. No pneumothorax. No acute osseous abnormality. IMPRESSION: 1. No active disease. 2.  Aortic Atherosclerosis (ICD10-I70.0). Electronically Signed   By: Iven Finn M.D.   On: 07/09/2021 19:00   DG Hip Unilat W or Wo Pelvis 2-3 Views Right  Result Date: 07/09/2021 CLINICAL DATA:  Right hip fracture EXAM: DG HIP (WITH OR WITHOUT PELVIS) 2-3V RIGHT COMPARISON:  None Available. FINDINGS: There is an acute right subcapital femoral neck fracture with mild external rotation of the distal fracture fragment. Femoral head appears seated within the right acetabulum. Mild superimposed right hip degenerative arthritis. Visualized left hip appears intact. Pelvis appears intact. Soft tissues are unremarkable. IMPRESSION: Acute right subcapital femoral neck fracture.  No dislocation. Electronically Signed   By: Fidela Salisbury M.D.   On: 07/09/2021 18:43      Kerria Sapien T. [place]  If 7PM-7AM, please contact night-coverage [email or web address] 07/10/2021, 3:32 PM

## 2021-07-10 NOTE — Anesthesia Procedure Notes (Signed)
Procedure Name: Intubation Date/Time: 07/10/2021 8:08 AM  Performed by: Dorann Lodge, CRNAPre-anesthesia Checklist: Patient identified, Emergency Drugs available, Suction available and Patient being monitored Patient Re-evaluated:Patient Re-evaluated prior to induction Oxygen Delivery Method: Circle System Utilized Preoxygenation: Pre-oxygenation with 100% oxygen Induction Type: IV induction Ventilation: Mask ventilation without difficulty and Oral airway inserted - appropriate to patient size Laryngoscope Size: 4 and Mac Grade View: Grade I Tube type: Oral Tube size: 7.5 mm Number of attempts: 1 Airway Equipment and Method: Stylet and Oral airway Placement Confirmation: ETT inserted through vocal cords under direct vision, positive ETCO2 and breath sounds checked- equal and bilateral Secured at: 23 cm Tube secured with: Tape Dental Injury: Teeth and Oropharynx as per pre-operative assessment

## 2021-07-10 NOTE — Transfer of Care (Signed)
Immediate Anesthesia Transfer of Care Note  Patient: Ian Gray  Procedure(s) Performed: HIP HEMIARTHROPLASTY (Right: Hip)  Patient Location: PACU  Anesthesia Type:General and Regional  Level of Consciousness: awake and drowsy  Airway & Oxygen Therapy: Patient Spontanous Breathing  Post-op Assessment: Report given to RN and Post -op Vital signs reviewed and stable  Post vital signs: Reviewed and stable  Last Vitals:  Vitals Value Taken Time  BP 128/61 07/10/21 1007  Temp    Pulse 82 07/10/21 1008  Resp 16 07/10/21 1008  SpO2 94 % 07/10/21 1008  Vitals shown include unvalidated device data.  Last Pain:  Vitals:   07/10/21 0742  TempSrc:   PainSc: 5          Complications: No notable events documented.

## 2021-07-11 DIAGNOSIS — D638 Anemia in other chronic diseases classified elsewhere: Secondary | ICD-10-CM | POA: Diagnosis not present

## 2021-07-11 DIAGNOSIS — D62 Acute posthemorrhagic anemia: Secondary | ICD-10-CM

## 2021-07-11 DIAGNOSIS — S72001A Fracture of unspecified part of neck of right femur, initial encounter for closed fracture: Secondary | ICD-10-CM | POA: Diagnosis not present

## 2021-07-11 DIAGNOSIS — R350 Frequency of micturition: Secondary | ICD-10-CM

## 2021-07-11 DIAGNOSIS — E273 Drug-induced adrenocortical insufficiency: Secondary | ICD-10-CM | POA: Diagnosis not present

## 2021-07-11 DIAGNOSIS — R739 Hyperglycemia, unspecified: Secondary | ICD-10-CM

## 2021-07-11 DIAGNOSIS — D509 Iron deficiency anemia, unspecified: Secondary | ICD-10-CM

## 2021-07-11 DIAGNOSIS — R7303 Prediabetes: Secondary | ICD-10-CM

## 2021-07-11 DIAGNOSIS — W19XXXA Unspecified fall, initial encounter: Secondary | ICD-10-CM | POA: Diagnosis not present

## 2021-07-11 LAB — CBC
HCT: 32.9 % — ABNORMAL LOW (ref 39.0–52.0)
Hemoglobin: 10.6 g/dL — ABNORMAL LOW (ref 13.0–17.0)
MCH: 28 pg (ref 26.0–34.0)
MCHC: 32.2 g/dL (ref 30.0–36.0)
MCV: 87 fL (ref 80.0–100.0)
Platelets: 145 10*3/uL — ABNORMAL LOW (ref 150–400)
RBC: 3.78 MIL/uL — ABNORMAL LOW (ref 4.22–5.81)
RDW: 13.3 % (ref 11.5–15.5)
WBC: 8.6 10*3/uL (ref 4.0–10.5)
nRBC: 0 % (ref 0.0–0.2)

## 2021-07-11 LAB — MAGNESIUM: Magnesium: 1.9 mg/dL (ref 1.7–2.4)

## 2021-07-11 LAB — IRON AND TIBC
Iron: 23 ug/dL — ABNORMAL LOW (ref 45–182)
Saturation Ratios: 9 % — ABNORMAL LOW (ref 17.9–39.5)
TIBC: 259 ug/dL (ref 250–450)
UIBC: 236 ug/dL

## 2021-07-11 LAB — RENAL FUNCTION PANEL
Albumin: 3.2 g/dL — ABNORMAL LOW (ref 3.5–5.0)
Anion gap: 7 (ref 5–15)
BUN: 21 mg/dL (ref 8–23)
CO2: 27 mmol/L (ref 22–32)
Calcium: 8.5 mg/dL — ABNORMAL LOW (ref 8.9–10.3)
Chloride: 102 mmol/L (ref 98–111)
Creatinine, Ser: 1.37 mg/dL — ABNORMAL HIGH (ref 0.61–1.24)
GFR, Estimated: 55 mL/min — ABNORMAL LOW (ref >60)
Glucose, Bld: 140 mg/dL — ABNORMAL HIGH (ref 70–99)
Phosphorus: 5 mg/dL — ABNORMAL HIGH (ref 2.5–4.6)
Potassium: 3.9 mmol/L (ref 3.5–5.1)
Sodium: 136 mmol/L (ref 135–145)

## 2021-07-11 LAB — RETICULOCYTES
Immature Retic Fract: 11.6 % (ref 2.3–15.9)
RBC.: 3.77 MIL/uL — ABNORMAL LOW (ref 4.22–5.81)
Retic Count, Absolute: 60.7 10*3/uL (ref 19.0–186.0)
Retic Ct Pct: 1.6 % (ref 0.4–3.1)

## 2021-07-11 LAB — HEMOGLOBIN A1C
Hgb A1c MFr Bld: 6.1 % — ABNORMAL HIGH (ref 4.8–5.6)
Mean Plasma Glucose: 128.37 mg/dL

## 2021-07-11 LAB — FOLATE: Folate: 6.2 ng/mL (ref >5.9)

## 2021-07-11 LAB — FERRITIN: Ferritin: 228 ng/mL (ref 24–336)

## 2021-07-11 LAB — VITAMIN B12: Vitamin B-12: 1240 pg/mL — ABNORMAL HIGH (ref 180–914)

## 2021-07-11 MED ORDER — SODIUM CHLORIDE 0.9 % IV SOLN
250.0000 mg | Freq: Once | INTRAVENOUS | Status: AC
  Administered 2021-07-11: 250 mg via INTRAVENOUS
  Filled 2021-07-11: qty 20

## 2021-07-11 MED ORDER — POLYETHYLENE GLYCOL 3350 17 G PO PACK
17.0000 g | PACK | Freq: Two times a day (BID) | ORAL | Status: DC
  Administered 2021-07-11 (×2): 17 g via ORAL
  Filled 2021-07-11 (×4): qty 1

## 2021-07-11 MED ORDER — SENNOSIDES-DOCUSATE SODIUM 8.6-50 MG PO TABS: 1.0000 | ORAL_TABLET | Freq: Two times a day (BID) | ORAL | Status: AC

## 2021-07-11 MED ORDER — SENNOSIDES-DOCUSATE SODIUM 8.6-50 MG PO TABS
1.0000 | ORAL_TABLET | Freq: Two times a day (BID) | ORAL | Status: DC
  Administered 2021-07-11: 1 via ORAL
  Filled 2021-07-11 (×4): qty 1

## 2021-07-11 MED ORDER — CELECOXIB 200 MG PO CAPS
200.0000 mg | ORAL_CAPSULE | Freq: Two times a day (BID) | ORAL | Status: DC
  Administered 2021-07-11 (×2): 200 mg via ORAL
  Filled 2021-07-11 (×2): qty 1

## 2021-07-11 NOTE — Evaluation (Signed)
Physical Therapy Evaluation Patient Details Name: Ian Gray MRN: [id number] DOB: Aug 13, 1950 Today's Date: 07/11/2021  History of Present Illness  The pt is a 71 yo male presenting 6/9 with R hip fx sustained in a fall at home. Pt is now s/p R hemiarthroplasty on 6/10. 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   Clinical Impression  Pt in bed upon arrival of PT, agreeable to evaluation at this time. 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 The pt musc be able to assist his wife with all mobility and transfers, has no other consistent assist at home. The pt now presents with limitations in functional mobility, strength, power, dynamic stability, and endurance due to above dx, and will continue to benefit from skilled PT to address these deficits. He initially completed sit-stand transfers with minA, but progressed to minG within session. Was limited to~20 ft ambulation with use of RW due to fatigue and pain. I expect the pt to make steady progress, but will need RW for mobility in addition to HHPT and aide to improve pt safety with mobility in the home as his wife is unable to provide any assistance. The pt plans to have his children assist as they are able, but all work full time. Will continue to benefit from skilled PT to progress functional activity tolerance, strength, and stability with OOB mobility.         Recommendations for follow up therapy are one component of a multi-disciplinary discharge planning process, led by the attending physician.  Recommendations may be updated based on patient status, additional functional criteria and insurance authorization.  Follow Up Recommendations Home health PT, [facility] aide     Assistance Recommended at Discharge Intermittent Supervision/Assistance  Patient can return home with the following  A little help with walking and/or transfers;A little help with bathing/dressing/bathroom;Assistance with cooking/housework;Assist for transportation;Help with stairs or ramp for entrance    Equipment Recommendations Rolling walker (2 wheels)  Recommendations for Other Services       Functional Status Assessment Patient has had a recent decline in their functional status and demonstrates the ability to make significant improvements in function in a reasonable and predictable amount of time.     Precautions / Restrictions Precautions Precautions: Posterior Hip;Fall Precaution Booklet Issued: Yes (comment) Restrictions Weight Bearing Restrictions: Yes RLE Weight Bearing: Weight bearing as tolerated      Mobility  Bed Mobility Overal bed mobility: Needs Assistance Bed Mobility: Supine to Sit     Supine to sit: Supervision     General bed mobility comments: increased time, line management    Transfers Overall transfer level: Needs assistance Equipment used: Rolling walker (2 wheels) Transfers: Sit to/from Stand, Bed to chair/wheelchair/BSC Sit to Stand: Min assist, Min guard   Step pivot transfers: Min guard       General transfer comment: minA for initial rise and steadying, small lateral steps to [facility] with miNG    Ambulation/Gait Ambulation/Gait assistance: Min guard Gait Distance (Feet): 5 Feet (+ 20 ft) Assistive device: Rolling walker (2 wheels) Gait Pattern/deviations: Step-to pattern, Decreased stride length, Decreased weight shift to right, Shuffle Gait velocity: decreased Gait velocity interpretation: <1.31 ft/sec, indicative of household ambulator   General Gait Details: slowed but stable, dependent on  BUE support    Balance Overall balance assessment: Mild deficits observed, not formally tested                                            Pertinent Vitals/Pain Pain Assessment Pain Assessment: 0-10 Pain Score: 6  Pain Location: R hip, back Pain Descriptors / Indicators: Discomfort, Grimacing Pain Intervention(s): Monitored during session, Premedicated before session, Repositioned, Limited activity within patient's tolerance    Home Living Family/patient expects to be discharged to:: Private residence Living Arrangements: Spouse/significant other Available Help at Discharge: Family;Available PRN/intermittently (pt is caregiver for his wife with MS) Type of Home: House Home Access: Ramped entrance       Home Layout: One level Home Equipment: BSC/3in1;Shower seat;Grab bars - toilet;Grab bars - tub/shower      Prior Function Prior Level of Function : Independent/Modified Independent;Driving;History of Falls (last six months)             Mobility Comments: pt reports 3 falls in last 6 months ADLs Comments: pt reports independence     Hand Dominance   Dominant Hand: Right    Extremity/Trunk Assessment   Upper Extremity Assessment Upper Extremity Assessment: Overall WFL for tasks assessed    Lower Extremity Assessment Lower Extremity Assessment: Overall WFL for tasks assessed    Cervical / Trunk Assessment Cervical / Trunk Assessment: Normal  Communication   Communication: No difficulties  Cognition Arousal/Alertness: Awake/alert Behavior During Therapy: WFL for tasks assessed/performed Overall Cognitive Status: Within Functional Limits for tasks assessed                                          General Comments General comments (skin integrity, edema, etc.): VSS on RA        Assessment/Plan    PT Assessment Patient needs continued PT services  PT Problem List Decreased strength;Decreased range of motion;Decreased activity tolerance;Decreased balance;Decreased mobility;Decreased coordination;Decreased cognition;Decreased safety awareness;Pain       PT  Treatment Interventions DME instruction;Gait training;Stair training;Functional mobility training;Therapeutic activities;Therapeutic exercise;Balance training;Patient/family education    PT Goals (Current goals can be found in the Care Plan section)  Acute Rehab PT Goals Patient Stated Goal: return home, remain independent so he can care for his wife PT Goal Formulation: With patient Time For Goal Achievement: 07/25/21 Potential to Achieve Goals: Good    Frequency Min 5X/week        AM-PAC PT "6 Clicks" Mobility  Outcome Measure Help needed turning from your back to your side while in a flat bed without using bedrails?: A Little Help needed moving from lying on your back to sitting on the side of a flat bed without using bedrails?: A Little Help needed moving to and from a bed to a chair (including a wheelchair)?: A Little Help needed standing up from a chair using your arms (e.g., wheelchair or bedside chair)?: A Little Help needed to walk in hospital room?: A Little Help needed climbing 3-5 steps with a railing? : A Lot 6 Click Score: 17    End of Session Equipment Utilized During Treatment: Gait belt Activity Tolerance: Patient tolerated treatment well Patient left: in chair;with call bell/phone within reach;with chair alarm set Nurse Communication: Mobility status PT Visit Diagnosis: Unsteadiness on feet (R26.81);Other abnormalities of gait and  mobility (R26.89);Repeated falls (R29.6);Muscle weakness (generalized) (M62.81);Pain Pain - Right/Left: Right Pain - part of body: Hip    Time: 1005-1050 PT Time Calculation (min) (ACUTE ONLY): 45 min   Charges:   PT Evaluation $PT Eval Moderate Complexity: 1 Mod PT Treatments $Gait Training: 8-22 mins $Therapeutic Exercise: 8-22 mins        [place], PT, DPT   Acute Rehabilitation Department  Sandra Cockayne 07/11/2021, 12:39 PM

## 2021-07-11 NOTE — Progress Notes (Addendum)
PROGRESS NOTE  Ian Gray EHU:[id number] DOB: 1950/12/22   PCP: Kristine Linea, MD  Patient is from: Home.  DOA: 07/09/2021 LOS: 2  Chief complaints Chief Complaint  Patient presents with   hip surgery     Brief Narrative / Interim history: 71 y.o. 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   Patient underwent right hip hemiarthroplasty on 6/10 by Dr. Marjo Bicker.  Subjective: Seen and examined earlier this morning.  No major events overnight or this morning.  He reports frequent urination but denies dysuria, urgency, suprapubic pain or new back pain.  Denies nausea or vomiting.  Eager to go home but also anxious.  He is a primary caregiver for his wife.  Objective: Vitals:   07/10/21 1210 07/10/21 1601 07/10/21 2011 07/11/21 1202  BP: 131/63 125/64 (!) 104/54 123/73  Pulse: 78 82 78 79  Resp: '13 17 18 20  '$ Temp: 97.9 F (36.6 C) 98 F (36.7 C) 98.1 F (36.7 C) 98.5 F (36.9 C)  TempSrc: Oral Oral Oral Oral  SpO2: 96% 95% 95% 99%  Weight:      Height:        Examination:  GENERAL: No apparent distress.  Nontoxic. HEENT: MMM.  Vision and hearing grossly intact.  NECK: Supple.  No apparent JVD.  RESP:  No IWOB.  Fair aeration bilaterally. CVS:  RRR. Heart sounds normal.  ABD/GI/GU: BS+. Abd soft, NTND.  MSK/EXT:  Moves extremities. No apparent deformity. No edema.  SKIN:  Dressing over surgical site DCI. NEURO: Awake and alert. Oriented appropriately.  No apparent focal neuro deficit. PSYCH: Calm. Normal affect.   Procedures:  6/10-right hip hemiarthroplasty  Microbiology summarized: None  Assessment and plan: Principal Problem:   Fracture of femoral neck, right, closed ([place]) Active Problems:   Adrenal insufficiency due to cancer therapy ([place])   CAD (coronary artery disease)   Urothelial cancer ([place])   Chronic kidney disease, stage 3a ([place])   Hypertension associated with diabetes ([place])   Prediabetes   Hyperlipidemia associated with type 2 diabetes mellitus (HCC)   OSA (obstructive sleep apnea)   Accidental fall   Anemia of chronic disease   Thrombocytopenia (H[facility])   Chronic back pain   Hyperphosphatemia   Frequent urination   Hyperglycemia   Surgical ABLA   Iron deficiency anemia  Closed right femoral neck fracture due to accidental fall at home -tripped and fell backward.  Did not need his head.  No LOC -S/p right hip hemiarthroplasty by Dr. Marjo Bicker on 6/10. -Scheduled Tylenol and as needed oxycodone and Dilaudid for pain control -Defer VTE prophylaxis to surgery.  SCD for now -Bowel regimen  Secondary adrenal insufficiency (due to immunotherapy on hydrocortisone 20/5): Stable. -Received Solu-Medrol 25 mg perioperatively. -Continue home Cortef 20 mg in the morning and 5 mg in the afternoon.  Metastatic urothelial cancer of the left renal pelvis s/p nephrectomy, chemo, currently on erdafitinib.  Bladder (s/p TURBT 03/01/2021) with nodal, osseous, hepatic metastasis,  Lynch syndrome/colon cancer, small bowel  adenocarcinoma s/p resection -Following with [facility] heme-onc Dr. Alverda Skeans. -Hold home erdafitinib given risk for infection in the setting of surgery  CAD (s/p stenting x5 most recently DES to OM2/OM3 April 2021): Stable.  No cardiopulmonary symptoms. -Continue home meds  CKD-3A: Relatively stable. Recent Labs     07/09/21 1807 07/10/21 0619 07/11/21 0159  BUN 29* 22 21  CREATININE 1.31* 1.42* 1.37*  -Continue monitoring  Surgical ABLA/iron deficiency anemia/anemia of chronic disease: Operative EBL 300 cc.  Anemia panel with iron deficiency. Recent Labs    07/09/21 1807 07/10/21 0619 07/11/21 0159  HGB 12.8* 11.5* 10.6*  -IV ferric gluconate 250 mg x 1  -Monitor H&H   Chronic back pain: Stable. -Pain regimen as above.  Prediabetes/hyperglycemia: A1c 6.1%.  Does not seem to be on medication.  No previous A1c in chart.  Mild hyperglycemia likely from steroid.  With then acceptable range.  Essential hypertension:  normotensive. -Continue home amlodipine  Frequent urination: Could be from IV fluid and and/or BPH.  No dysuria, urgency, fever, suprapubic tenderness -Continue home Flomax -Discontinue IV fluid  Thrombocytopenia: Mild and stable.  OSA not using CPAP  Body mass index is 28.48 kg/m.         DVT prophylaxis:  SCDs Start: 07/10/21 1151 SCDs Start: 07/10/21 0029 On aspirin 81 mg twice daily  Code Status: Full code Family Communication: None at bedside today. Level of care: Telemetry Medical Status is: Inpatient Remains inpatient appropriate because: Right femoral neck fracture   Final disposition: Home with home health and DME on 6/12. Consultants:  Orthopedic surgery  Sch Meds:  Scheduled Meds:  amLODipine  5 mg Oral QHS   aspirin  81 mg Oral BID   atorvastatin  40 mg Oral QHS   celecoxib  200 mg Oral BID   ezetimibe  10 mg Oral QHS   gabapentin  800 mg Oral QID   hydrocortisone  20 mg Oral Daily   hydrocortisone  5 mg Oral q1600   methylPREDNISolone (SOLU-MEDROL) injection  25 mg Intravenous 60 min Pre-Op   pantoprazole  40 mg Oral QHS   polyethylene glycol  17 g Oral BID   rOPINIRole  2 mg Oral QHS   senna-docusate  1 tablet Oral BID   tamsulosin  0.4 mg Oral QHS   tranexamic acid (CYKLOKAPRON) 2,000 mg in sodium chloride 0.9 % 50 mL Topical  Application  9,735 mg Topical Once   Continuous Infusions:  ferric gluconate (FERRLECIT) IVPB     PRN Meds:.diphenhydrAMINE, HYDROmorphone (DILAUDID) injection, menthol-cetylpyridinium **OR** phenol, metoCLOPramide **OR** metoCLOPramide (REGLAN) injection, ondansetron **OR** ondansetron (ZOFRAN) IV, oxyCODONE-acetaminophen, sodium phosphate, sorbitol  Antimicrobials: Anti-infectives (From admission, onward)    Start     Dose/Rate Route Frequency Ordered Stop   07/10/21 1245  ceFAZolin (ANCEF) IVPB 1 g/50 mL premix        1 g 100 mL/hr over 30 Minutes Intravenous Every 6 hours 07/10/21 1150 07/10/21 1812   07/10/21 0730  ceFAZolin (ANCEF) IVPB 2g/100 mL premix        2 g 200 mL/hr over 30 Minutes Intravenous On call to O.R. 07/10/21 3299 07/10/21 0812   07/10/21 0722  ceFAZolin (ANCEF) 2-4 GM/100ML-% IVPB       Note to Pharmacy: Nyoka Cowden D: cabinet override      07/10/21 0722 07/10/21 0821        I have personally reviewed the following labs and images: CBC: Recent Labs  Lab 07/09/21 1807 07/10/21 0619 07/11/21 0159  WBC 9.9 7.5 8.6  NEUTROABS 8.7*  --   --   HGB 12.8* 11.5* 10.6*  HCT 38.9* 34.4* 32.9*  MCV 87.8 85.4 87.0  PLT 179 139* 145*   BMP &GFR Recent Labs  Lab 07/09/21 1807 07/10/21 0619 07/11/21 0159  NA 137 137 136  K 4.1 3.7 3.9  CL 103 102 102  CO2 '25 25 27  '$ GLUCOSE 114* 102* 140*  BUN 29* 22 21  CREATININE 1.31* 1.42* 1.37*  CALCIUM 9.7 8.8* 8.5*  MG  --   --  1.9  PHOS  --   --  5.0*   Estimated Creatinine Clearance: 58.4 mL/min (A) (by C-G formula based on SCr of 1.37 mg/dL (H)). Liver & Pancreas: Recent Labs  Lab 07/09/21 1807 07/11/21 0159  AST 24  --   ALT 24  --   ALKPHOS 63  --   BILITOT 1.0  --   PROT 6.8  --   ALBUMIN 4.0 3.2*   No results for input(s): "LIPASE", "AMYLASE" in the last 168 hours. No results for input(s): "AMMONIA" in the last 168 hours. Diabetic: Recent Labs    07/11/21 0159  HGBA1C 6.1*    Recent Labs  Lab 07/10/21 1010  GLUCAP 121*   Cardiac Enzymes: No results for input(s): "CKTOTAL", "CKMB", "CKMBINDEX", "TROPONINI" in the last 168 hours. No results for input(s): "PROBNP" in the last 8760 hours. Coagulation Profile: Recent Labs  Lab 07/09/21 1807  INR 1.1   Thyroid Function Tests: No results for input(s): "TSH", "T4TOTAL", "FREET4", "T3FREE", "THYROIDAB" in the last 72 hours. Lipid Profile: No results for input(s): "CHOL", "HDL", "LDLCALC", "TRIG", "CHOLHDL", "LDLDIRECT" in the last 72 hours. Anemia Panel: Recent Labs    07/11/21 0159  VITAMINB12 1,240*  FOLATE 6.2  FERRITIN 228  TIBC 259  IRON 23*  RETICCTPCT 1.6   Urine analysis: No results found for: "COLORURINE", "APPEARANCEUR", "LABSPEC", "PHURINE", "GLUCOSEU", "HGBUR", "BILIRUBINUR", "KETONESUR", "PROTEINUR", "UROBILINOGEN", "NITRITE", "LEUKOCYTESUR" Sepsis Labs: Invalid input(s): "PROCALCITONIN", "LACTICIDVEN"  Microbiology: Recent Results (from the past 240 hour(s))  SARS Coronavirus 2 by RT PCR (hospital order, performed in [facility] hospital lab) *cepheid single result test* Anterior Nasal Swab     Status: None   Collection Time: 07/09/21 11:51 PM   Specimen: Anterior Nasal Swab  Result Value Ref Range Status   SARS Coronavirus 2 by RT PCR NEGATIVE NEGATIVE Final    Comment: (NOTE) SARS-CoV-2 target nucleic acids are NOT DETECTED.  The SARS-CoV-2 RNA is generally detectable in upper and lower respiratory specimens during the acute phase of infection. The lowest concentration of SARS-CoV-2 viral copies this assay can detect is 250 copies / mL. A negative result does not preclude SARS-CoV-2 infection and should not be used as the sole basis for treatment or other patient management decisions.  A negative result may occur with improper specimen collection / handling, submission of specimen other than nasopharyngeal swab, presence of viral mutation(s) within the areas targeted by this  assay, and inadequate number of viral copies (<250 copies / mL). A negative result must be combined with clinical observations, patient history, and epidemiological information.  Fact Sheet for Patients:   https://www.patel.info/  Fact Sheet for Healthcare Providers: https://hall.com/  This test is not yet approved or  cleared by the [place] FDA and has been authorized for detection and/or diagnosis of SARS-CoV-2 by FDA under an Emergency Use Authorization (EUA).  This EUA will remain in effect (meaning this test can be used) for the duration of the  COVID-19 declaration under Section 564(b)(1) of the Act, 21 U.S.C. section 360bbb-3(b)(1), unless the authorization is terminated or revoked sooner.  Performed at [facility], [place] [address] [place], [place] [address]   Surgical pcr screen     Status: Abnormal   Collection Time: 07/10/21 12:55 PM   Specimen: Nasal Mucosa; Nasal Swab  Result Value Ref Range Status   MRSA, PCR POSITIVE (A) NEGATIVE Final    Comment: RESULT CALLED TO, READ BACK BY AND VERIFIED WITH: A. MANNING RN, AT [phone] 07/10/21 BY D. VANHOOK    Staphylococcus aureus POSITIVE (A) NEGATIVE Final    Comment: (NOTE) The Xpert SA Assay (FDA approved for NASAL specimens in patients 69 years of age and older), is one component of a comprehensive surveillance program. It is not intended to diagnose infection nor to guide or monitor treatment. Performed at [facility], [place] [address], [place], [place] [address]     Radiology Studies: No results found.    Oluwadarasimi Favor T. [place]  If 7PM-7AM, please contact night-coverage [email or web address] 07/11/2021, 1:12 PM

## 2021-07-11 NOTE — Progress Notes (Signed)
Orthopaedic Trauma Progress Note  SUBJECTIVE: Doing okay this morning.  Reports mild pain about operative site.  Biggest concern currently is low back pain.  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  The back normally feels better when he is up and moving around versus laying still.  No chest pain. No SOB. No nausea/vomiting. No other complaints.  Is eager to get back home to his wife who he is normally the caregiver for.  OBJECTIVE:  Vitals:   07/10/21 1601 07/10/21 2011  BP: 125/64 (!) 104/54  Pulse: 82 78  Resp: 17 18  Temp: 98 F (36.7 C) 98.1 F (36.7 C)  SpO2: 95% 95%    General: Sitting up in bed, no acute distress Respiratory: No increased work of breathing.  Right lower extremity: Aquacel dressing with small area of drainage but otherwise clean, dry, intact.  Mildly tender over the hip as expected.  No tenderness over the knee or throughout the lower leg.  Tolerates ankle dorsiflexion plantarflexion.  Tolerates gentle knee motion.  Endorses sensation throughout extremity.  Neurovascularly intact.  IMAGING: Stable post op imaging.   LABS:  Results for orders placed or performed during the hospital encounter of 07/09/21 (from the past 24 hour(s))  Glucose, capillary     Status: Abnormal   Collection Time: 07/10/21 10:10 AM  Result Value Ref Range   Glucose-Capillary 121 (H) 70 - 99 mg/dL  Surgical pcr screen     Status: Abnormal   Collection Time: 07/10/21 12:55 PM   Specimen: Nasal Mucosa; Nasal Swab  Result Value Ref Range   MRSA, PCR POSITIVE (A) NEGATIVE   Staphylococcus aureus POSITIVE (A) NEGATIVE  Renal function panel     Status: Abnormal   Collection Time: 07/11/21  1:59 AM  Result Value Ref Range   Sodium 136 135 - 145 mmol/L   Potassium 3.9 3.5 - 5.1 mmol/L   Chloride 102 98 - 111 mmol/L   CO2 27 22 - 32 mmol/L   Glucose, Bld 140 (H) 70 - 99 mg/dL   BUN 21 8 - 23 mg/dL   Creatinine, Ser 1.37 (H)  0.61 - 1.24 mg/dL   Calcium 8.5 (L) 8.9 - 10.3 mg/dL   Phosphorus 5.0 (H) 2.5 - 4.6 mg/dL   Albumin 3.2 (L) 3.5 - 5.0 g/dL   GFR, Estimated 55 (L) >60 mL/min   Anion gap 7 5 - 15  Magnesium     Status: None   Collection Time: 07/11/21  1:59 AM  Result Value Ref Range   Magnesium 1.9 1.7 - 2.4 mg/dL  CBC     Status: Abnormal   Collection Time: 07/11/21  1:59 AM  Result Value Ref Range   WBC 8.6 4.0 - 10.5 K/uL   RBC 3.78 (L) 4.22 - 5.81 MIL/uL   Hemoglobin 10.6 (L) 13.0 - 17.0 g/dL   HCT 32.9 (L) 39.0 - 52.0 %   MCV 87.0 80.0 - 100.0 fL   MCH 28.0 26.0 - 34.0 pg   MCHC 32.2 30.0 - 36.0 g/dL   RDW 13.3 11.5 - 15.5 %   Platelets 145 (L) 150 - 400 K/uL   nRBC 0.0 0.0 - 0.2 %  Vitamin B12     Status: Abnormal   Collection Time: 07/11/21  1:59 AM  Result Value Ref Range   Vitamin B-12 1,240 (H) 180 - 914 pg/mL  Folate     Status: None   Collection  Time: 07/11/21  1:59 AM  Result Value Ref Range   Folate 6.2 >5.9 ng/mL  Iron and TIBC     Status: Abnormal   Collection Time: 07/11/21  1:59 AM  Result Value Ref Range   Iron 23 (L) 45 - 182 ug/dL   TIBC 259 250 - 450 ug/dL   Saturation Ratios 9 (L) 17.9 - 39.5 %   UIBC 236 ug/dL  Ferritin     Status: None   Collection Time: 07/11/21  1:59 AM  Result Value Ref Range   Ferritin 228 24 - 336 ng/mL  Reticulocytes     Status: Abnormal   Collection Time: 07/11/21  1:59 AM  Result Value Ref Range   Retic Ct Pct 1.6 0.4 - 3.1 %   RBC. 3.77 (L) 4.22 - 5.81 MIL/uL   Retic Count, Absolute 60.7 19.0 - 186.0 K/uL   Immature Retic Fract 11.6 2.3 - 15.9 %  Hemoglobin A1c     Status: Abnormal   Collection Time: 07/11/21  1:59 AM  Result Value Ref Range   Hgb A1c MFr Bld 6.1 (H) 4.8 - 5.6 %   Mean Plasma Glucose 128.37 mg/dL    ASSESSMENT: Ian Gray is a 71 y.o. male, 1 Day Post-Op s/p RIGHT HIP HEMIARTHROPLASTY  CV/Blood loss: Acute blood loss anemia, Hgb 10.6 this AM. Hemodynamically stable  PLAN: Weightbearing: WBAT RLE ROM:  Okay for ROM as tolerated Incisional and dressing care: Dressings left intact until follow-up  Showering: Okay to shower, Aquacel dressing may get wet Orthopedic device(s): Walker Pain management: Continue current regimen.  Add Celebrex for arthritic back pain VTE prophylaxis: Aspirin, SCDs Foley/Lines:  No foley, KVO IVFs Dispo: PT/OT evaluation today.  Okay for discharge from ortho standpoint once cleared by medicine team and therapies D/C recommendations: -Norco for pain control -Aspirin for DVT prophylaxis  Follow - up plan: 2 weeks with Dr. Toney Sang information:  Katha Hamming MD, Rushie Nyhan PA-C. After hours and holidays please check [email or web address] for group call information for [facility]   Gwinda Passe, PA-C [phone] (office) [email or web address]

## 2021-07-11 NOTE — TOC Transition Note (Addendum)
Transition of Care [facility]) - CM/SW Discharge Note   Patient Details  Name: Ian Gray MRN: [id number] Date of Birth: 01/10/1951  Transition of Care [facility]) CM/SW Contact:  Bartholomew Crews, RN Phone Number: [phone] 07/11/2021, 11:07 AM   Clinical Narrative:     Spoke with patient's son in law on hospital phone. Patient was working with PT and was aware of the discussion as it occurred. Patient agreeable to [facility] PT, [place]. Referral accepted by [facility]. No further TOC needs identified at this time.   Update: Return call to patient's room. Spoke with Richardson Landry. Advised of accepting home health agency and disciplines. Family to provide transportation home. Advised of potential caregiver agencies - info placed on AVS. Anticipate dc tomorrow.   Final next level of care: Home w Home Health Services Barriers to Discharge: No Barriers Identified   Patient Goals and CMS Choice Patient states their goals for this hospitalization and ongoing recovery are:: return home to care for his wife CMS Medicare.gov Compare Post Acute Care list provided to:: Patient Choice offered to / list presented to : Patient  Discharge Placement                       Discharge Plan and Services                DME Arranged: N/A DME Agency: NA       HH Arranged: PT, Nurse's Aide HH Agency: [place] [facility] Agency Contacted: 07/11/21 Time HH Agency Contacted: 1104 Representative spoke with at [place]: Malachy Mood  Social Determinants of Health ([place]) Interventions     Readmission Risk Interventions     No data to display

## 2021-07-12 ENCOUNTER — Encounter (HOSPITAL_COMMUNITY): Payer: Self-pay | Admitting: Orthopedic Surgery

## 2021-07-12 DIAGNOSIS — D62 Acute posthemorrhagic anemia: Secondary | ICD-10-CM

## 2021-07-12 DIAGNOSIS — D509 Iron deficiency anemia, unspecified: Secondary | ICD-10-CM

## 2021-07-12 DIAGNOSIS — S72001A Fracture of unspecified part of neck of right femur, initial encounter for closed fracture: Secondary | ICD-10-CM | POA: Diagnosis not present

## 2021-07-12 DIAGNOSIS — W19XXXA Unspecified fall, initial encounter: Secondary | ICD-10-CM | POA: Diagnosis not present

## 2021-07-12 DIAGNOSIS — E273 Drug-induced adrenocortical insufficiency: Secondary | ICD-10-CM | POA: Diagnosis not present

## 2021-07-12 LAB — RENAL FUNCTION PANEL
Albumin: 3.2 g/dL — ABNORMAL LOW (ref 3.5–5.0)
Anion gap: 8 (ref 5–15)
BUN: 23 mg/dL (ref 8–23)
CO2: 24 mmol/L (ref 22–32)
Calcium: 8.5 mg/dL — ABNORMAL LOW (ref 8.9–10.3)
Chloride: 105 mmol/L (ref 98–111)
Creatinine, Ser: 1.62 mg/dL — ABNORMAL HIGH (ref 0.61–1.24)
GFR, Estimated: 45 mL/min — ABNORMAL LOW (ref >60)
Glucose, Bld: 111 mg/dL — ABNORMAL HIGH (ref 70–99)
Phosphorus: 3.1 mg/dL (ref 2.5–4.6)
Potassium: 4 mmol/L (ref 3.5–5.1)
Sodium: 137 mmol/L (ref 135–145)

## 2021-07-12 LAB — MAGNESIUM: Magnesium: 1.9 mg/dL (ref 1.7–2.4)

## 2021-07-12 LAB — CBC
HCT: 32.3 % — ABNORMAL LOW (ref 39.0–52.0)
Hemoglobin: 10.6 g/dL — ABNORMAL LOW (ref 13.0–17.0)
MCH: 28.6 pg (ref 26.0–34.0)
MCHC: 32.8 g/dL (ref 30.0–36.0)
MCV: 87.1 fL (ref 80.0–100.0)
Platelets: 164 10*3/uL (ref 150–400)
RBC: 3.71 MIL/uL — ABNORMAL LOW (ref 4.22–5.81)
RDW: 13.5 % (ref 11.5–15.5)
WBC: 8.1 10*3/uL (ref 4.0–10.5)
nRBC: 0 % (ref 0.0–0.2)

## 2021-07-12 MED ORDER — GABAPENTIN 400 MG PO CAPS
400.0000 mg | ORAL_CAPSULE | Freq: Four times a day (QID) | ORAL | Status: DC
  Administered 2021-07-12: 400 mg via ORAL
  Filled 2021-07-12: qty 1

## 2021-07-12 MED ORDER — GABAPENTIN 400 MG PO CAPS: 400.0000 mg | ORAL_CAPSULE | Freq: Three times a day (TID) | ORAL | 0 refills | Status: AC

## 2021-07-12 NOTE — Care Management Important Message (Signed)
Important Message  Patient Details  Name: Ian Gray MRN: [id number] Date of Birth: 1951/01/15   Medicare Important Message Given:  Yes     Eldon Zietlow Montine Circle 07/12/2021, 3:54 PM

## 2021-07-12 NOTE — Progress Notes (Signed)
Physical Therapy Treatment Patient Details Name: Ian Gray MRN: [id number] DOB: 24-Dec-1950 Today's Date: 07/12/2021   History of Present Illness The pt is a 71 yo male presenting 6/9 with R hip fx sustained in a fall at home. Pt is now s/p R hemiarthroplasty on 6/10. 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    PT Comments    Pt received supine and agreeable to session with continued progress towards acute goals. Pt supervision with all bed mobility and transfers and requires min guard assist for ambulation in hall; pt needing light cues to keep RW in contact with ground and for improved gait mechanics. Pt DME in room and adjusted to pt height and comfort. Pt educated re; safe car entry/exit, safety with assisting wife for while maintain precautions and recommendations on avoiding heavy transfer assist, and importance of continued mobility. Pt able to don LE clothing with set up and min assist. RN present at end of session for transport to d/c lounge. Pt continues to benefit from skilled PT services to progress toward functional mobility goals.    Recommendations for follow up therapy are one component of a multi-disciplinary discharge planning process, led by the attending physician.  Recommendations may be updated based on patient status, additional functional criteria and insurance authorization.  Follow Up Recommendations  Home health PT     Assistance Recommended at Discharge Intermittent Supervision/Assistance  Patient can return home with the following A little help with walking and/or transfers;A little help with bathing/dressing/bathroom;Assistance with cooking/housework;Assist for transportation;Help with stairs or ramp for entrance   Equipment Recommendations  Rolling walker (2 wheels)     Recommendations for Other Services       Precautions / Restrictions Precautions Precautions: Posterior Hip;Fall Precaution Booklet Issued: Yes (comment) Restrictions Weight Bearing Restrictions: Yes RLE Weight Bearing: Weight bearing as tolerated     Mobility  Bed Mobility Overal bed mobility: Modified Independent Bed Mobility: Supine to Sit     Supine to sit: Modified independent (Device/Increase time)          Transfers Overall transfer level: Needs assistance Equipment used: Rolling walker (2 wheels) Transfers: Sit to/from Stand, Bed to chair/wheelchair/BSC Sit to Stand: Supervision   Step pivot transfers: Supervision            Ambulation/Gait Ambulation/Gait assistance: Min guard Gait Distance (Feet): 80 Feet Assistive device: Rolling walker (2 wheels) Gait Pattern/deviations: Step-to pattern, Decreased stride length, Decreased weight shift to right, Shuffle Gait velocity: decreased     General Gait Details: slowed but stable, dependent on BUE support   Stairs             Wheelchair Mobility    Modified Rankin (Stroke Patients Only)       Balance Overall balance assessment: Mild deficits observed, not formally tested                                          Cognition Arousal/Alertness: Awake/alert Behavior During Therapy: WFL for tasks assessed/performed Overall Cognitive Status: Within Functional Limits for tasks assessed  Exercises      General Comments General comments (skin integrity, edema, etc.): VSS on RA      Pertinent Vitals/Pain Pain Assessment Pain Assessment: Faces Faces Pain Scale: Hurts little more Pain Location: back more than hip Pain Descriptors / Indicators: Sore Pain Intervention(s): Premedicated before session, Monitored during session    Home Living Family/patient expects to be discharged to:: Private residence Living  Arrangements: Spouse/significant other Available Help at Discharge: Family;Available PRN/intermittently (pt is caregiver for his wife with MS) Type of Home: House Home Access: Ramped entrance       Home Layout: One level Home Equipment: BSC/3in1;Shower seat;Grab bars - toilet;Grab bars - tub/shower      Prior Function            PT Goals (current goals can now be found in the care plan section) Acute Rehab PT Goals PT Goal Formulation: With patient Time For Goal Achievement: 07/25/21    Frequency    Min 5X/week      PT Plan      Co-evaluation              AM-PAC PT "6 Clicks" Mobility   Outcome Measure  Help needed turning from your back to your side while in a flat bed without using bedrails?: A Little Help needed moving from lying on your back to sitting on the side of a flat bed without using bedrails?: A Little Help needed moving to and from a bed to a chair (including a wheelchair)?: A Little Help needed standing up from a chair using your arms (e.g., wheelchair or bedside chair)?: A Little Help needed to walk in hospital room?: A Little Help needed climbing 3-5 steps with a railing? : A Lot 6 Click Score: 17    End of Session Equipment Utilized During Treatment: Gait belt Activity Tolerance: Patient tolerated treatment well Patient left: with call bell/phone within reach;in bed;with nursing/sitter in room (sitting EOB with RN to trasnport to D/C lounge) Nurse Communication: Mobility status PT Visit Diagnosis: Unsteadiness on feet (R26.81);Other abnormalities of gait and mobility (R26.89);Repeated falls (R29.6);Muscle weakness (generalized) (M62.81);Pain Pain - Right/Left: Right Pain - part of body: Hip     Time: 9357-0177 PT Time Calculation (min) (ACUTE ONLY): 24 min  Charges:  $Gait Training: 8-22 mins $Therapeutic Activity: 8-22 mins                     Jasiri Hanawalt R. PTA Acute Rehabilitation Services Office: [place] 07/12/2021, 9:56 AM

## 2021-07-12 NOTE — Evaluation (Signed)
Occupational Therapy Evaluation Patient Details Name: Ian Gray MRN: [id number] DOB: 07-16-1950 Today's Date: 07/12/2021   History of Present Illness The pt is a 71 yo male presenting 6/9 with R hip fx sustained in a fall at home. Pt is now s/p R hemiarthroplasty on 6/10. 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   Clinical Impression   PTA, pt lives with spouse, typically Independent in all daily tasks and provides physical assist for his wife. Pt presents ow with pain well controlled (reports chronic back pain > hip) with expected difficulties in managing LB ADLs d/t posterior hip precautions. Extended time spent educating on managing ADLs/IADLs in the home with AE. Overall, pt Setup for UB ADL, Supervision for LB ADLs (when using AE) and Supervision for transfers using RW. Provided handouts for AE (pt plans to order online) and posterior hip precautions to maximize education carryover. Pt with concerns regarding how to return to caregiver role, currently talking with family and medical team regarding best plan. Advised pt on safe strategies to provide light assist for wife without breaking precautions and recommended avoiding heavy transfer assist while recovering from sx. Recommend HHOT follow-up at DC.      Recommendations for follow up therapy are one component of a multi-disciplinary discharge planning process, led by the attending physician.  Recommendations may be updated based on patient status, additional functional criteria and insurance authorization.   Follow Up Recommendations  Home health OT    Assistance Recommended at Discharge Set up Supervision/Assistance  Patient can return home with the following Assistance with cooking/housework;Assist for transportation;Help with stairs or ramp for  entrance    Functional Status Assessment  Patient has had a recent decline in their functional status and demonstrates the ability to make significant improvements in function in a reasonable and predictable amount of time.  Equipment Recommendations  Other (comment) (RW; AE for LB ADLs (pt planning to purchase AE online))    Recommendations for Other Services       Precautions / Restrictions Precautions Precautions: Posterior Hip;Fall Precaution Booklet Issued: Yes (comment) Restrictions Weight Bearing Restrictions: Yes RLE Weight Bearing: Weight bearing as tolerated      Mobility Bed Mobility Overal bed mobility: Modified Independent Bed Mobility: Supine to Sit     Supine to sit: Modified independent (Device/Increase time)          Transfers Overall transfer level: Needs assistance Equipment used: Rolling walker (2 wheels) Transfers: Sit to/from Stand, Bed to chair/wheelchair/BSC Sit to Stand: Supervision     Step pivot transfers: Supervision            Balance Overall balance assessment: Mild deficits observed, not formally tested                                         ADL either performed or assessed with clinical judgement   ADL Overall ADL's : Needs assistance/impaired Eating/Feeding: Independent;Sitting   Grooming: Set up;Standing   Upper Body Bathing: Set up;Sitting   Lower Body Bathing: Supervison/ safety;Sit to/from stand;With adaptive equipment   Upper Body Dressing : Set up   Lower Body Dressing: Supervision/safety;Sitting/lateral leans;Sit to/from stand;With adaptive equipment   Toilet Transfer: Supervision/safety;Ambulation;Rolling walker (2 wheels)   Toileting- Clothing Manipulation and Hygiene: Supervision/safety;Sitting/lateral  lean;Sit to/from stand       Functional mobility during ADLs: Supervision/safety;Rolling walker (2 wheels) General ADL Comments: Educated on AE for LB ADLs, hip precautions in IADL  routine (picking up items from floor with reacher, use of walker tray to transport items from kitchen if needed) and light tasks that he may be able to assist his wife with at DC (advised against heavy lifting/transferring, but pt reports his son uncomfortable with peri care for pt's wife - advised that he could likely do that while son assists with transfer/standing with pt's wife)     Vision Ability to See in Adequate Light: 0 Adequate Patient Visual Report: No change from baseline Vision Assessment?: No apparent visual deficits     Perception     Praxis      Pertinent Vitals/Pain Pain Assessment Pain Assessment: Faces Faces Pain Scale: Hurts little more Pain Location: back more than hip Pain Descriptors / Indicators: Sore Pain Intervention(s): Premedicated before session, Monitored during session     Hand Dominance Right   Extremity/Trunk Assessment Upper Extremity Assessment Upper Extremity Assessment: Overall WFL for tasks assessed   Lower Extremity Assessment Lower Extremity Assessment: Defer to PT evaluation   Cervical / Trunk Assessment Cervical / Trunk Assessment: Normal   Communication Communication Communication: No difficulties   Cognition Arousal/Alertness: Awake/alert Behavior During Therapy: WFL for tasks assessed/performed Overall Cognitive Status: Within Functional Limits for tasks assessed                                       General Comments  VSS on RA    Exercises     Shoulder Instructions      Home Living Family/patient expects to be discharged to:: Private residence Living Arrangements: Spouse/significant other Available Help at Discharge: Family;Available PRN/intermittently (pt is caregiver for his wife with MS) Type of Home: House Home Access: Ramped entrance     Home Layout: One level     Bathroom Shower/Tub: Walk-in shower;Tub/shower unit   Bathroom Toilet: Handicapped height Bathroom Accessibility: Yes   Home  Equipment: BSC/3in1;Shower seat;Grab bars - toilet;Grab bars - tub/shower          Prior Functioning/Environment Prior Level of Function : Independent/Modified Independent;Driving;History of Falls (last six months)             Mobility Comments: pt reports 3 falls in last 6 months ADLs Comments: pt reports independence, assists wife with transfers from her lift chair to [facility], assists her with ADLs        OT Problem List: Decreased activity tolerance;Impaired balance (sitting and/or standing);Decreased knowledge of precautions;Decreased knowledge of use of DME or AE;Pain      OT Treatment/Interventions: Self-care/ADL training;Therapeutic exercise;Energy conservation;DME and/or AE instruction;Therapeutic activities;Patient/family education;Balance training    OT Goals(Current goals can be found in the care plan section) Acute Rehab OT Goals Patient Stated Goal: be able to recover well, have safe plan for wife's caregiving needs OT Goal Formulation: With patient Time For Goal Achievement: 07/26/21 Potential to Achieve Goals: Good  OT Frequency: Min 2X/week    Co-evaluation              AM-PAC OT "6 Clicks" Daily Activity     Outcome Measure Help from another person eating meals?: None Help from another person taking care of personal grooming?: A Little Help from another person toileting, which includes using toliet, bedpan, or urinal?: A Little Help  from another person bathing (including washing, rinsing, drying)?: A Little Help from another person to put on and taking off regular upper body clothing?: A Little Help from another person to put on and taking off regular lower body clothing?: A Little 6 Click Score: 19   End of Session Equipment Utilized During Treatment: Rolling walker (2 wheels) Nurse Communication: Mobility status;Precautions  Activity Tolerance: Patient tolerated treatment well Patient left: in chair;with call bell/phone within reach  OT Visit  Diagnosis: Unsteadiness on feet (R26.81);Other abnormalities of gait and mobility (R26.89);Muscle weakness (generalized) (M62.81)                Time: 5361-4431 OT Time Calculation (min): 37 min Charges:  OT General Charges $OT Visit: 1 Visit OT Evaluation $OT Eval Low Complexity: 1 Low OT Treatments $Self Care/Home Management : 8-22 mins  Ian Gray, OTR/L Acute Rehab Services Office: [phone]   Layla Maw 07/12/2021, 8:16 AM

## 2021-07-12 NOTE — Discharge Summary (Signed)
Physician Discharge Summary  Ian Gray LGX:[id number] DOB: 1950/08/10 DOA: 07/09/2021  PCP: Kristine Linea, MD  Admit date: 07/09/2021 Discharge date: 07/12/2021 Admitted From: Home Disposition: Home Recommendations for Outpatient Follow-up:  Follow ups as below. Please obtain CBC and BMP in about a week. Please follow up on the following pending results: None  Home Health: [facility] Equipment/Devices: Rolling walker  Discharge Condition: Stable CODE STATUS: Full code  Follow-up Information     [name], MD. Schedule an appointment as soon as possible for a visit in 2 week(s).   Specialty: Orthopedic Surgery Contact information: [address] [place] [address] [phone]         Kristine Linea, MD. Schedule an appointment as soon as possible for a visit in 1 week(s).   Specialty: Family Medicine Contact information: [place] [phone] [place] [facility] up.   Why: Someone from the office will call to schedule home health visits Contact information: [address] [place], [place] [address] [place]. Call.   Why: Call to discuss private pay caregiver services Contact information: [place]. Call.   Why: Call to discuss private pay caregiver services Contact information: [facility] 71 y.o. 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    Patient underwent right hip hemiarthroplasty on 6/10 by Dr. Marjo Bicker.  Had EBL of 300 cc.  No postop complication.  He was cleared for discharge by orthopedic surgery and therapy.  See individual problem list below for more.   Problems addressed during this hospitalization Principal Problem:   Fracture of femoral neck, right, closed ([place]) Active Problems:   Adrenal insufficiency due to cancer therapy ([place])   CAD (coronary artery disease)   Urothelial cancer ([place])   Chronic kidney disease, stage 3a ([place])   Hypertension associated with diabetes ([place])   Prediabetes   Hyperlipidemia associated with type 2 diabetes mellitus (H[facility])   OSA (obstructive sleep apnea)   Accidental fall   Anemia of chronic disease   Thrombocytopenia ([facility])   Chronic back pain   Hyperphosphatemia   Frequent urination   Hyperglycemia   Surgical ABLA   Iron deficiency anemia   Closed right femoral neck fracture due to accidental fall at home -tripped and fell backward.  Did not need his head.  No LOC -S/p right hip hemiarthroplasty by Dr. Marjo Bicker on 6/10. -Scheduled Tylenol and as needed oxycodone and Dilaudid for pain control -Prescription for Norco 10/325 mg issued by orthopedic surgery. -Aspirin 81 mg twice daily for VTE prophylaxis per surgery. -Advised good bowel regimen -Outpatient follow-up as above. -Home health PT and rolling walker ordered.   Secondary adrenal insufficiency (due to immunotherapy on hydrocortisone 20/5): Stable. -  Received Solu-Medrol 25 mg perioperatively. -Continue home Cortef 20 mg in the morning and 5 mg in the afternoon.   Metastatic urothelial cancer of the left renal pelvis s/p nephrectomy, chemo, currently on erdafitinib.  Bladder (s/p TURBT 03/01/2021) with nodal, osseous, hepatic  metastasis,  Lynch syndrome/colon cancer, small bowel adenocarcinoma s/p resection -Following with [facility] heme-onc Dr. Alverda Skeans. -Advised to hold erdafitinib given risk for infection in the setting of surgery until he talks to his oncologist   CAD (s/p stenting x5 most recently DES to OM2/OM3 April 2021): Stable.  No cardiopulmonary symptoms. -Continue home meds   CKD-3A: Creatinine slightly elevated likely from Celebrex Recent Labs    07/09/21 1807 07/10/21 0619 07/11/21 0159 07/12/21 0155  BUN 29* '22 21 23  '$ CREATININE 1.31* 1.42* 1.37* 1.62*  -Discontinued Celebrex. He reports taking Mobic at home although this is not on his med list. -Advised to avoid NSAID altogether. -Decrease gabapentin from 800 mg 4 times daily to 400 mg 3 times daily given renal function -Recheck BMP in about a week   Surgical ABLA/iron deficiency anemia/anemia of chronic disease: Operative EBL 300 cc.  Anemia panel with iron deficiency.  H&H stable after initial drop. Recent Labs    07/09/21 1807 07/10/21 0619 07/11/21 0159 07/12/21 0155  HGB 12.8* 11.5* 10.6* 10.6*  -Received IV ferric gluconate 250 mg x 1  -Recheck CBC at follow-up.   Chronic back pain: Stable. -Norco and as needed Tylenol -Gabapentin as above   Prediabetes/hyperglycemia: A1c 6.1%.  Does not seem to be on medication.  No previous A1c in chart.  Mild hyperglycemia likely from steroid.  With then acceptable range.   Essential hypertension:  normotensive. -Continue home amlodipine   Frequent urination: Likely from IV fluid and and/or BPH.  No dysuria, urgency, fever, suprapubic tenderness: Improved. -Continue home Flomax   Thrombocytopenia: Mild and stable.   OSA not using CPAP   Vital signs Vitals:   07/11/21 1202 07/11/21 2020 07/12/21 0620 07/12/21 0722  BP: 123/73 130/75 138/69 (!) 131/58  Pulse: 79 79  81  Temp: 98.5 F (36.9 C) 98.6 F (37 C) 98.1 F (36.7 C) 98.4 F (36.9 C)  Resp: 20 19     Height:      Weight:      SpO2: 99% 96%  94%  TempSrc: Oral Oral Oral Oral  BMI (Calculated):         Discharge exam  GENERAL: No apparent distress.  Nontoxic. HEENT: MMM.  Vision and hearing grossly intact.  NECK: Supple.  No apparent JVD.  RESP:  No IWOB.  Fair aeration bilaterally. CVS:  RRR. Heart sounds normal.  ABD/GI/GU: BS+. Abd soft, NTND.  MSK/EXT:  Moves extremities. No apparent deformity. No edema.  SKIN: Dressing over right hip DCI. NEURO: Awake and alert. Oriented appropriately.  No apparent focal neuro deficit. PSYCH: Calm. Normal affect.   Discharge Instructions Discharge Instructions     Call MD for:  extreme fatigue   Complete by: As directed    Call MD for:  persistant dizziness or light-headedness   Complete by: As directed    Call MD for:  redness, tenderness, or signs of infection (pain, swelling, redness, odor or green/yellow discharge around incision site)   Complete by: As directed    Call MD for:  severe uncontrolled pain   Complete by: As directed    Call MD for:  temperature >100.4   Complete by: As directed    Diet - low sodium heart  healthy   Complete by: As directed    Discharge instructions   Complete by: As directed    You were hospitalized with right hip fracture after fall at home for which you have been treated surgically.  We are discharging you to follow-up with your orthopedic surgeon per their recommendation.  We will recommend holding your chemotherapy until you talk to your oncologist.  Please reach out to them as soon as possible.  Review your new medication list and the directions on your medications before you take them.   Discharge wound care:   Complete by: As directed    Reinforce dressing as needed   Increase activity slowly   Complete by: As directed       Allergies as of 07/12/2021       Reactions   Ivp Dye [iodinated Contrast Media] Hives        Medication List     STOP taking these medications     gabapentin 800 MG tablet Commonly known as: NEURONTIN Replaced by: gabapentin 400 MG capsule       TAKE these medications    amLODipine 5 MG tablet Commonly known as: NORVASC Take 5 mg by mouth at bedtime.   aspirin EC 81 MG tablet Take 1 tablet (81 mg total) by mouth 2 (two) times daily. TO PREVENT BLOOD CLOTS   atorvastatin 40 MG tablet Commonly known as: LIPITOR Take 40 mg by mouth at bedtime.   Balversa 4 MG Tabs Generic drug: Erdafitinib Take 4 mg by mouth at bedtime.   ezetimibe 10 MG tablet Commonly known as: ZETIA Take 10 mg by mouth at bedtime.   gabapentin 400 MG capsule Commonly known as: NEURONTIN Take 1 capsule (400 mg total) by mouth 3 (three) times daily. Replaces: gabapentin 800 MG tablet   HYDROcodone-acetaminophen 10-325 MG tablet Commonly known as: NORCO Take 1 tablet by mouth every 4 (four) hours as needed. What changed:  when to take this reasons to take this   hydrocortisone 10 MG tablet Commonly known as: CORTEF Take 20 mg by mouth daily.   hydrocortisone 5 MG tablet Commonly known as: CORTEF Take 5 mg by mouth daily at 4 PM.   NONFORMULARY OR COMPOUNDED ITEM 1 Dose by Mouth Rinse route at bedtime. Berns mouthwash- dry mouth/mouth sores caused by chemo   ondansetron 8 MG tablet Commonly known as: ZOFRAN Take 8 mg by mouth every 8 (eight) hours as needed for nausea or vomiting.   pantoprazole 40 MG tablet Commonly known as: PROTONIX Take 40 mg by mouth at bedtime.   prochlorperazine 10 MG tablet Commonly known as: COMPAZINE Take 10 mg by mouth every 6 (six) hours as needed for vomiting or nausea.   rOPINIRole 2 MG 24 hr tablet Commonly known as: REQUIP XL Take 2 mg by mouth at bedtime.   senna-docusate 8.6-50 MG tablet Commonly known as: Senokot-S Take 1 tablet by mouth 2 (two) times daily.   SYSTANE OP Place 2 drops into both eyes daily.   tamsulosin 0.4 MG Caps capsule Commonly known as: FLOMAX Take 0.4 mg by mouth  at bedtime.   Vitamin D (Ergocalciferol) 1.25 MG (50000 UNIT) Caps capsule Commonly known as: DRISDOL Take 50,000 Units by mouth every Sunday.   zolpidem 10 MG tablet Commonly known as: AMBIEN Take 10 mg by mouth at bedtime.               Discharge Care Instructions  (From admission, onward)  Start     Ordered   07/11/21 0000  Discharge wound care:       Comments: Reinforce dressing as needed   07/11/21 1040            Consultations: Orthopedic surgery  Procedures/Studies: Right hip hemiarthroplasty by Dr. Marjo Bicker on 6/10.   DG Hip Port Unilat With Pelvis 1V Right  Result Date: 07/10/2021 CLINICAL DATA:  Postop right hip arthroplasty. EXAM: DG HIP (WITH OR WITHOUT PELVIS) 1V PORT RIGHT COMPARISON:  07/09/2021. FINDINGS: New right hemiarthroplasty appears well seated and aligned. No acute fracture or evidence an operative complication. Soft tissue edema and air overlies the proximal right femur and right hip. IMPRESSION: 1. Well position and aligned right hip hemiarthroplasty. Electronically Signed   By: Lajean Manes M.D.   On: 07/10/2021 11:33   CT Head Wo Contrast  Result Date: 07/09/2021 CLINICAL DATA:  Head trauma, minor (Age >= 65y).  Fall. EXAM: CT HEAD WITHOUT CONTRAST TECHNIQUE: Contiguous axial images were obtained from the base of the skull through the vertex without intravenous contrast. RADIATION DOSE REDUCTION: This exam was performed according to the departmental dose-optimization program which includes automated exposure control, adjustment of the mA and/or kV according to patient size and/or use of iterative reconstruction technique. COMPARISON:  None Available. FINDINGS: Brain: No acute intracranial abnormality. Specifically, no hemorrhage, hydrocephalus, mass lesion, acute infarction, or significant intracranial injury. Vascular: No hyperdense vessel or unexpected calcification. Skull: No acute calvarial abnormality. Sinuses/Orbits: No acute  findings Other: None IMPRESSION: No acute intracranial abnormality. Electronically Signed   By: Rolm Baptise M.D.   On: 07/09/2021 19:23   DG Chest Port 1 View  Result Date: 07/09/2021 CLINICAL DATA:  [id number].  Preop EXAM: PORTABLE CHEST 1 VIEW COMPARISON:  Chest x-ray 05/16/2019, CT chest 01/31/2019 FINDINGS: Right chest wall Port-A-Cath with tip overlying the right atrium. The heart and mediastinal contours are unchanged. Atherosclerotic plaque. Coronary artery calcification. No focal consolidation. No pulmonary edema. No pleural effusion. No pneumothorax. No acute osseous abnormality. IMPRESSION: 1. No active disease. 2.  Aortic Atherosclerosis (ICD10-I70.0). Electronically Signed   By: Iven Finn M.D.   On: 07/09/2021 19:00   DG Hip Unilat W or Wo Pelvis 2-3 Views Right  Result Date: 07/09/2021 CLINICAL DATA:  Right hip fracture EXAM: DG HIP (WITH OR WITHOUT PELVIS) 2-3V RIGHT COMPARISON:  None Available. FINDINGS: There is an acute right subcapital femoral neck fracture with mild external rotation of the distal fracture fragment. Femoral head appears seated within the right acetabulum. Mild superimposed right hip degenerative arthritis. Visualized left hip appears intact. Pelvis appears intact. Soft tissues are unremarkable. IMPRESSION: Acute right subcapital femoral neck fracture.  No dislocation. Electronically Signed   By: Fidela Salisbury M.D.   On: 07/09/2021 18:43       The results of significant diagnostics from this hospitalization (including imaging, microbiology, ancillary and laboratory) are listed below for reference.     Microbiology: Recent Results (from the past 240 hour(s))  SARS Coronavirus 2 by RT PCR (hospital order, performed in [facility] hospital lab) *cepheid single result test* Anterior Nasal Swab     Status: None   Collection Time: 07/09/21 11:51 PM   Specimen: Anterior Nasal Swab  Result Value Ref Range Status   SARS Coronavirus 2 by RT PCR NEGATIVE NEGATIVE  Final    Comment: (NOTE) SARS-CoV-2 target nucleic acids are NOT DETECTED.  The SARS-CoV-2 RNA is generally detectable in upper and lower respiratory specimens during the acute phase of  infection. The lowest concentration of SARS-CoV-2 viral copies this assay can detect is 250 copies / mL. A negative result does not preclude SARS-CoV-2 infection and should not be used as the sole basis for treatment or other patient management decisions.  A negative result may occur with improper specimen collection / handling, submission of specimen other than nasopharyngeal swab, presence of viral mutation(s) within the areas targeted by this assay, and inadequate number of viral copies (<250 copies / mL). A negative result must be combined with clinical observations, patient history, and epidemiological information.  Fact Sheet for Patients:   https://www.patel.info/  Fact Sheet for Healthcare Providers: https://hall.com/  This test is not yet approved or  cleared by the [place] FDA and has been authorized for detection and/or diagnosis of SARS-CoV-2 by FDA under an Emergency Use Authorization (EUA).  This EUA will remain in effect (meaning this test can be used) for the duration of the COVID-19 declaration under Section 564(b)(1) of the Act, 21 U.S.C. section 360bbb-3(b)(1), unless the authorization is terminated or revoked sooner.  Performed at [facility], [place] [address], [place], [place] [address]   Surgical pcr screen     Status: Abnormal   Collection Time: 07/10/21 12:55 PM   Specimen: Nasal Mucosa; Nasal Swab  Result Value Ref Range Status   MRSA, PCR POSITIVE (A) NEGATIVE Final    Comment: RESULT CALLED TO, READ BACK BY AND VERIFIED WITH: A. MANNING RN, AT [phone] 07/10/21 BY D. VANHOOK    Staphylococcus aureus POSITIVE (A) NEGATIVE Final    Comment: (NOTE) The Xpert SA Assay (FDA approved for NASAL specimens in patients  50 years of age and older), is one component of a comprehensive surveillance program. It is not intended to diagnose infection nor to guide or monitor treatment. Performed at [facility], [place] [address], [place], [place] [address]      Labs:  CBC: Recent Labs  Lab 07/09/21 1807 07/10/21 0619 07/11/21 0159 07/12/21 0155  WBC 9.9 7.5 8.6 8.1  NEUTROABS 8.7*  --   --   --   HGB 12.8* 11.5* 10.6* 10.6*  HCT 38.9* 34.4* 32.9* 32.3*  MCV 87.8 85.4 87.0 87.1  PLT 179 139* 145* 164   BMP &GFR Recent Labs  Lab 07/09/21 1807 07/10/21 0619 07/11/21 0159 07/12/21 0155  NA 137 137 136 137  K 4.1 3.7 3.9 4.0  CL 103 102 102 105  CO2 '25 25 27 24  '$ GLUCOSE 114* 102* 140* 111*  BUN 29* '22 21 23  '$ CREATININE 1.31* 1.42* 1.37* 1.62*  CALCIUM 9.7 8.8* 8.5* 8.5*  MG  --   --  1.9 1.9  PHOS  --   --  5.0* 3.1   Estimated Creatinine Clearance: 49.4 mL/min (A) (by C-G formula based on SCr of 1.62 mg/dL (H)). Liver & Pancreas: Recent Labs  Lab 07/09/21 1807 07/11/21 0159 07/12/21 0155  AST 24  --   --   ALT 24  --   --   ALKPHOS 63  --   --   BILITOT 1.0  --   --   PROT 6.8  --   --   ALBUMIN 4.0 3.2* 3.2*   No results for input(s): "LIPASE", "AMYLASE" in the last 168 hours. No results for input(s): "AMMONIA" in the last 168 hours. Diabetic: Recent Labs    07/11/21 0159  HGBA1C 6.1*   Recent Labs  Lab 07/10/21 1010  GLUCAP 121*   Cardiac Enzymes: No results  for input(s): "CKTOTAL", "CKMB", "CKMBINDEX", "TROPONINI" in the last 168 hours. No results for input(s): "PROBNP" in the last 8760 hours. Coagulation Profile: Recent Labs  Lab 07/09/21 1807  INR 1.1   Thyroid Function Tests: No results for input(s): "TSH", "T4TOTAL", "FREET4", "T3FREE", "THYROIDAB" in the last 72 hours. Lipid Profile: No results for input(s): "CHOL", "HDL", "LDLCALC", "TRIG", "CHOLHDL", "LDLDIRECT" in the last 72 hours. Anemia Panel: Recent Labs    07/11/21 0159  VITAMINB12  1,240*  FOLATE 6.2  FERRITIN 228  TIBC 259  IRON 23*  RETICCTPCT 1.6   Urine analysis: No results found for: "COLORURINE", "APPEARANCEUR", "LABSPEC", "PHURINE", "GLUCOSEU", "HGBUR", "BILIRUBINUR", "KETONESUR", "PROTEINUR", "UROBILINOGEN", "NITRITE", "LEUKOCYTESUR" Sepsis Labs: Invalid input(s): "PROCALCITONIN", "LACTICIDVEN"   SIGNED:  Mercy Riding, MD  [facility] 07/12/2021, 6:07 PM

## 2021-07-12 NOTE — Care Management Important Message (Signed)
Important Message  Patient Details  Name: Ian Gray MRN: [id number] Date of Birth: 1951/01/22   Medicare Important Message Given:  Yes     Ian Gray [address] 07/12/2021, 4:20 PM

## 2021-07-12 NOTE — Plan of Care (Signed)
  Problem: Education: Goal: Knowledge of General Education information will improve Description: Including pain rating scale, medication(s)/side effects and non-pharmacologic comfort measures Outcome: Adequate for Discharge   Problem: Health Behavior/Discharge Planning: Goal: Ability to manage health-related needs will improve Outcome: Adequate for Discharge   Problem: Clinical Measurements: Goal: Ability to maintain clinical measurements within normal limits will improve Outcome: Adequate for Discharge Goal: Will remain free from infection Outcome: Adequate for Discharge Goal: Diagnostic test results will improve Outcome: Adequate for Discharge Goal: Respiratory complications will improve Outcome: Adequate for Discharge Goal: Cardiovascular complication will be avoided Outcome: Adequate for Discharge   Problem: Activity: Goal: Risk for activity intolerance will decrease Outcome: Adequate for Discharge   Problem: Nutrition: Goal: Adequate nutrition will be maintained Outcome: Adequate for Discharge   Problem: Coping: Goal: Level of anxiety will decrease Outcome: Adequate for Discharge   Problem: Elimination: Goal: Will not experience complications related to bowel motility Outcome: Adequate for Discharge Goal: Will not experience complications related to urinary retention Outcome: Adequate for Discharge   Problem: Pain Managment: Goal: General experience of comfort will improve Outcome: Adequate for Discharge   Problem: Safety: Goal: Ability to remain free from injury will improve Outcome: Adequate for Discharge   Problem: Skin Integrity: Goal: Risk for impaired skin integrity will decrease Outcome: Adequate for Discharge   Problem: Acute Rehab PT Goals(only PT should resolve) Goal: Patient Will Transfer Sit To/From Stand Outcome: Adequate for Discharge Goal: Pt Will Ambulate Outcome: Adequate for Discharge Goal: Pt/caregiver will Perform Home Exercise  Program Outcome: Adequate for Discharge   Problem: Acute Rehab OT Goals (only OT should resolve) Goal: Pt. Will Perform Lower Body Dressing Outcome: Adequate for Discharge Goal: OT Additional ADL Goal #1 Outcome: Adequate for Discharge

## 2021-07-13 LAB — SURGICAL PATHOLOGY

## 2021-12-31 DEATH — deceased

## 2023-01-09 IMAGING — DX DG CHEST 1V PORT
1 series · 1 of 1 positions shown · non-contrast
Comparison: Chest x-ray 05/16/2019, CT chest 01/31/2019

CLINICAL DATA: [id number].  Preop

EXAM:
PORTABLE CHEST 1 VIEW

[chest ap]
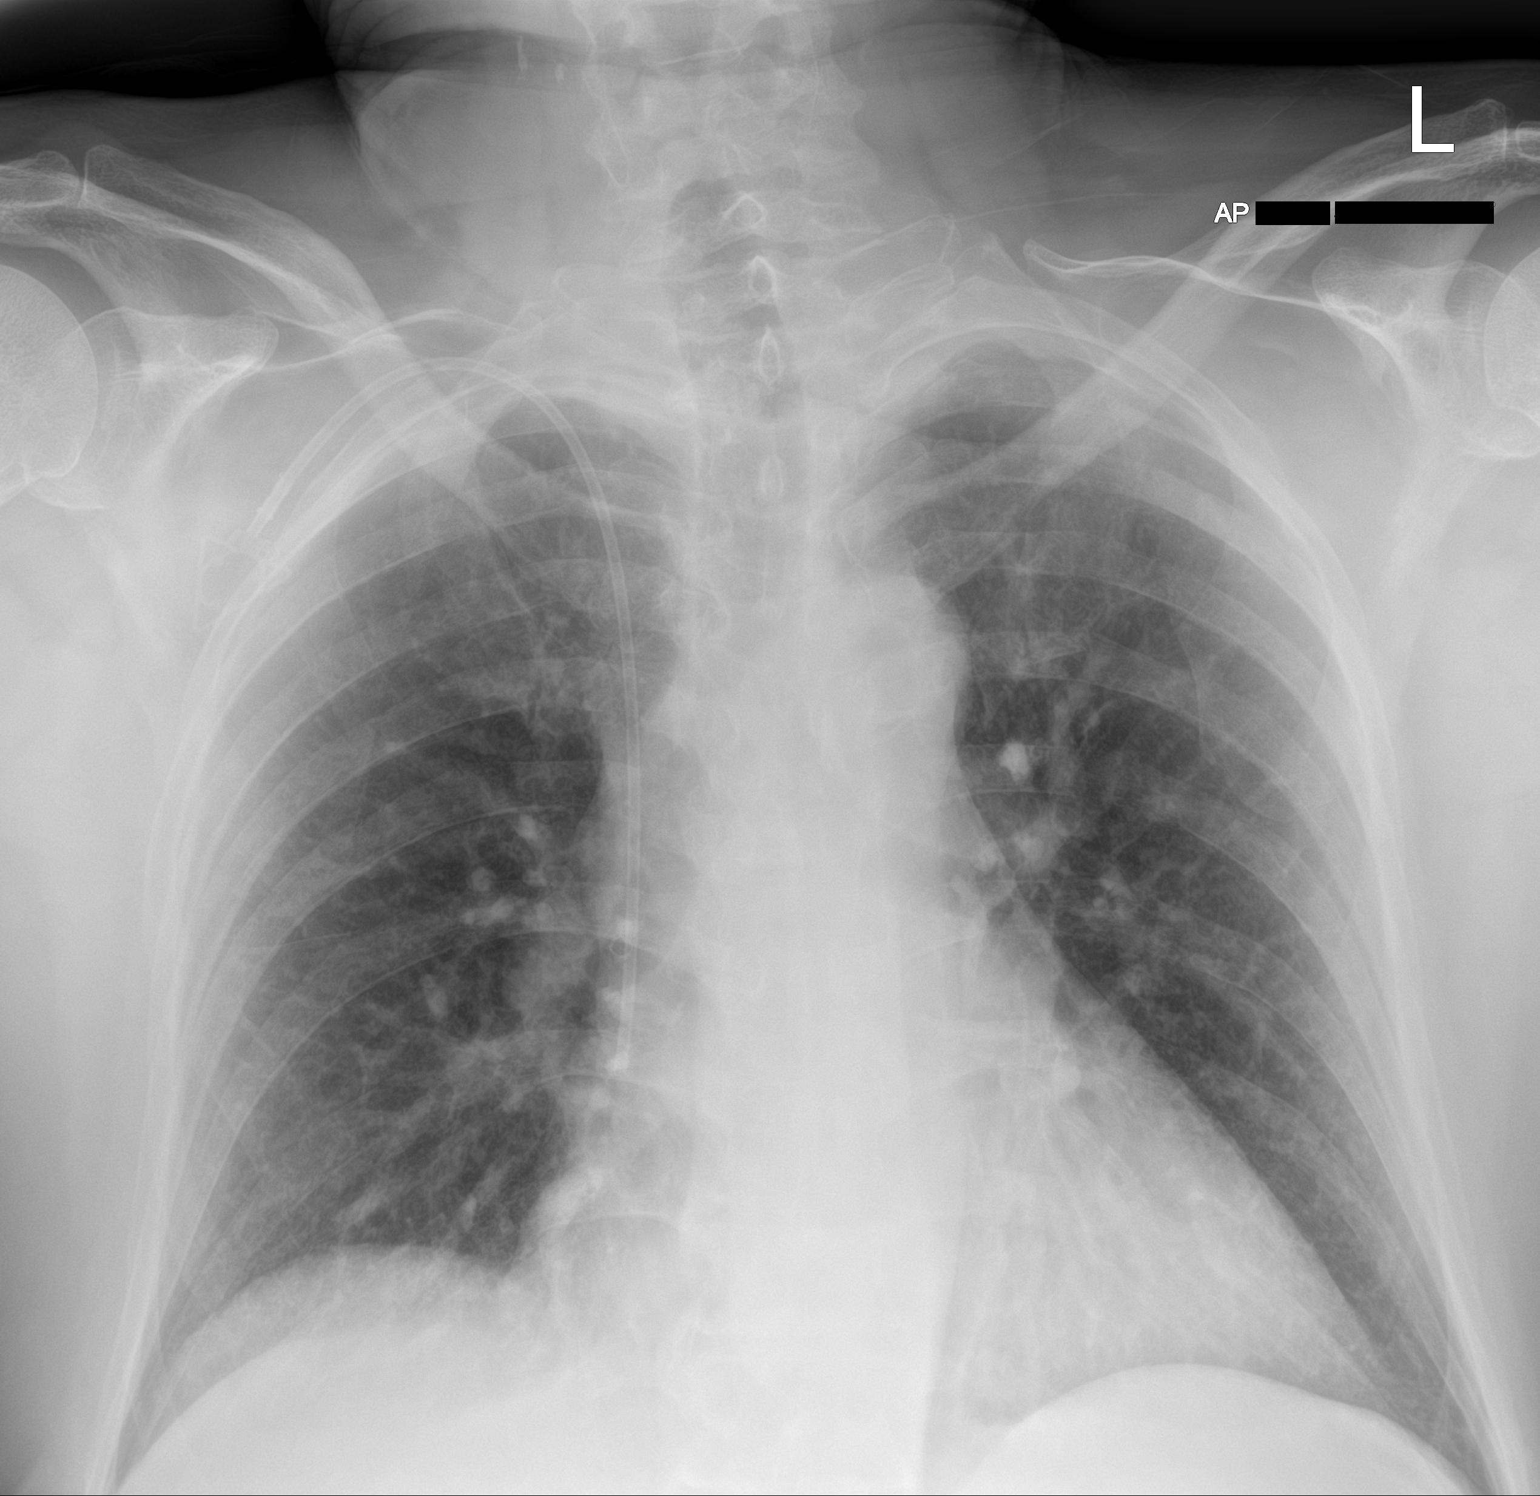

[1 of 1 positions shown; findings below may reference images not displayed]

FINDINGS: Right chest wall Port-A-Cath with tip overlying the right atrium.

The heart and mediastinal contours are unchanged. Atherosclerotic
plaque. Coronary artery calcification.

No focal consolidation. No pulmonary edema. No pleural effusion. No
pneumothorax.

No acute osseous abnormality.
IMPRESSION: 1. No active disease.
2.  Aortic Atherosclerosis ([id number]).

## 2023-01-09 IMAGING — CT CT HEAD W/O CM
3 series · 14 of 47 positions shown, 16 images · non-contrast
Comparison: None Available.

CLINICAL DATA: Head trauma, minor (Age >= 65y).  Fall.



[Series 3: head 5.0 mpr ax · axial · 0.32mm/px · z∈[+131,+273]mm · 8 of 35 slices shown, 10 images]
[im 3/35  brain]
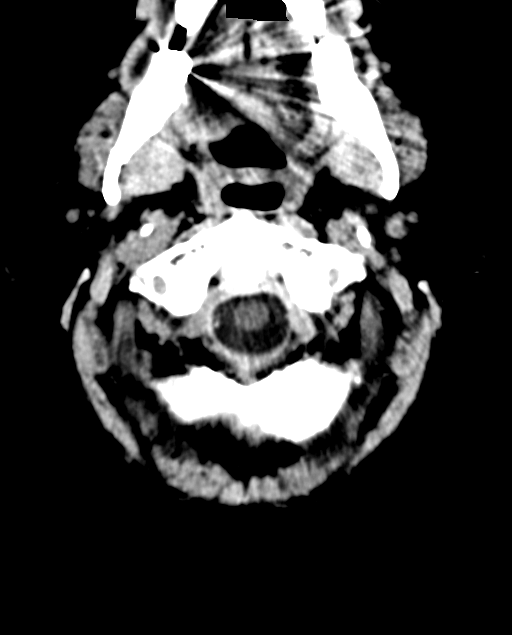
[im 3/35  bone]
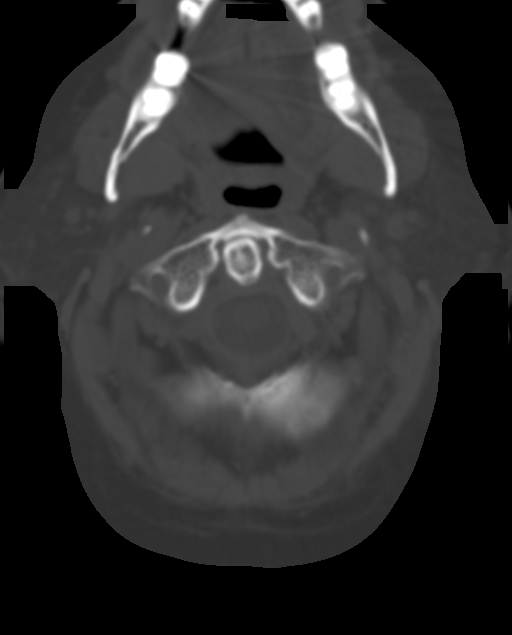
[im 8/35  brain]
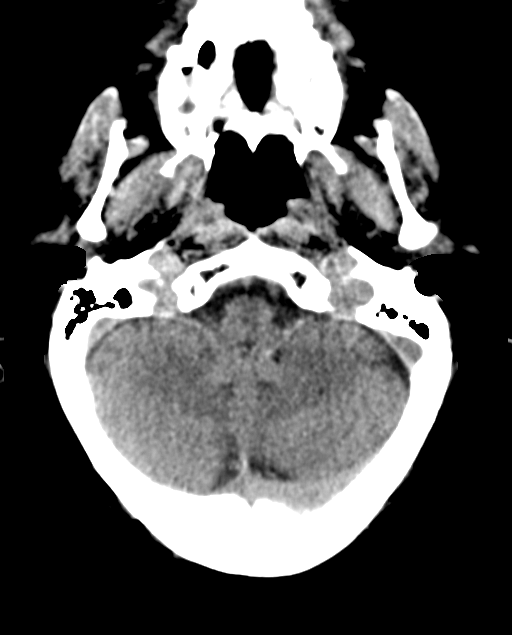
[im 11/35  brain]
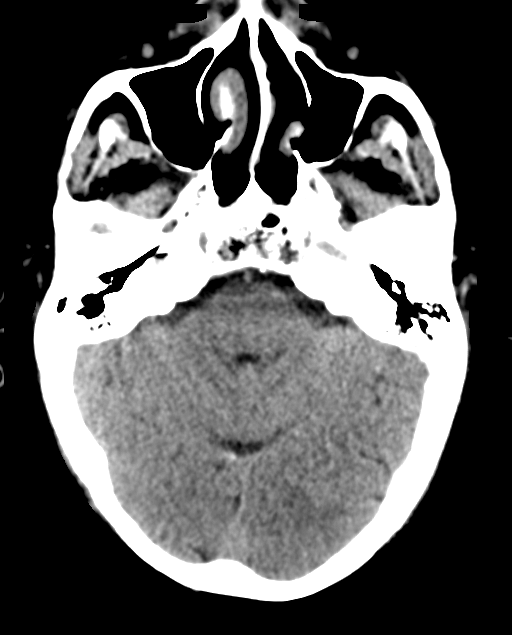
[im 16/35  brain]
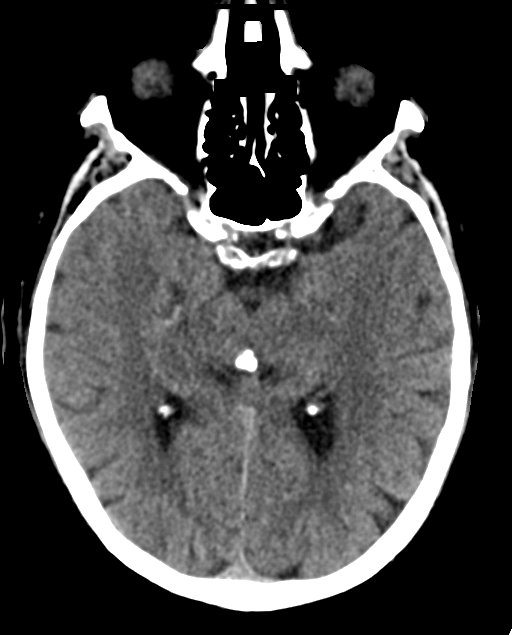
[im 19/35  brain]
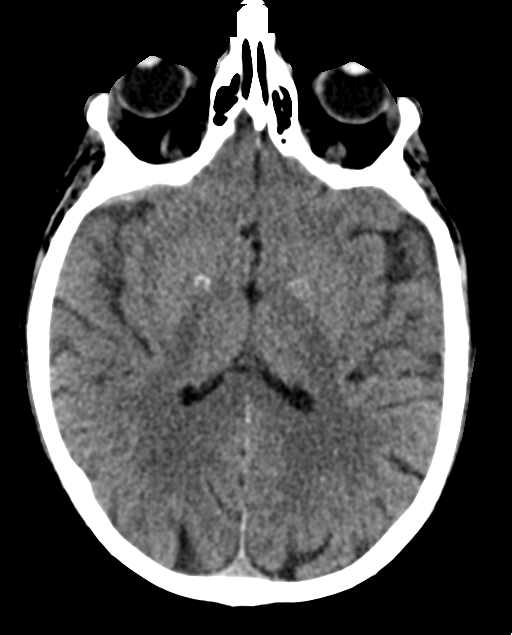
[im 19/35  bone]
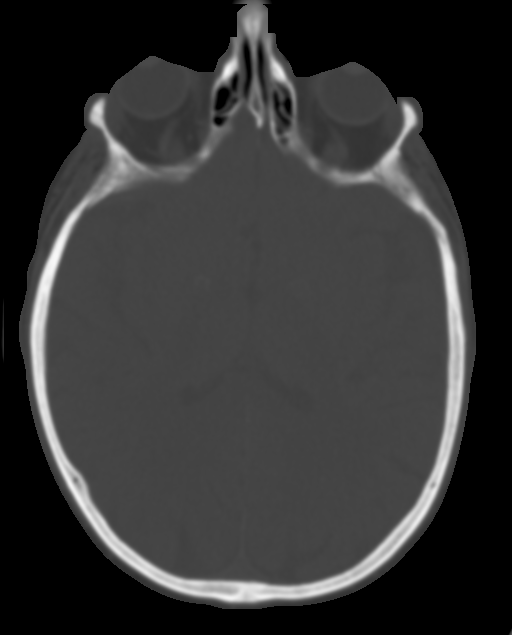
[im 24/35  brain]
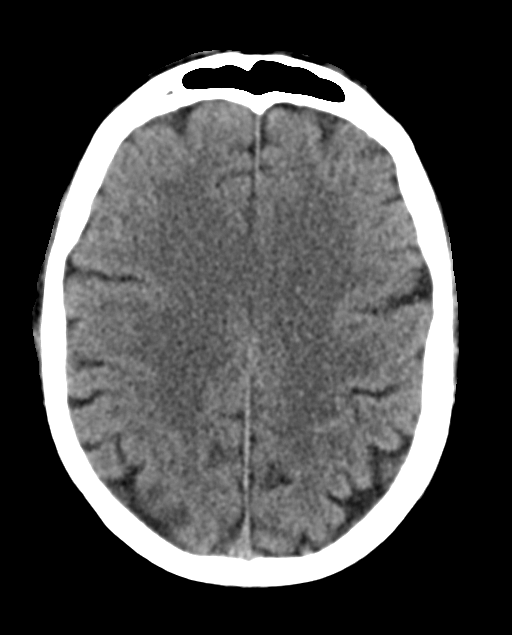
[im 27/35  brain]
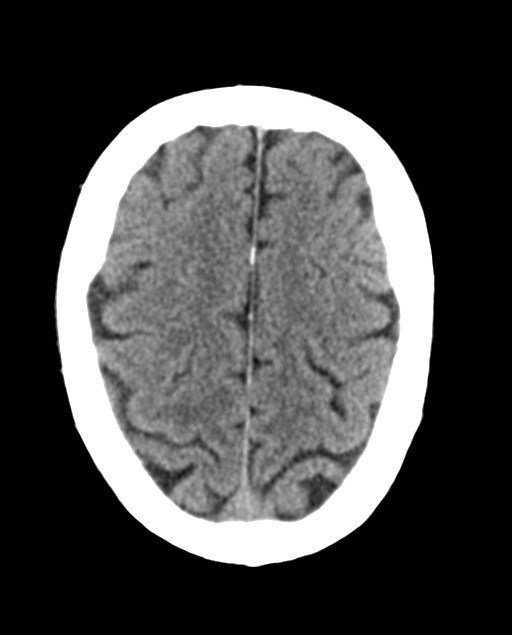
[im 32/35  brain]
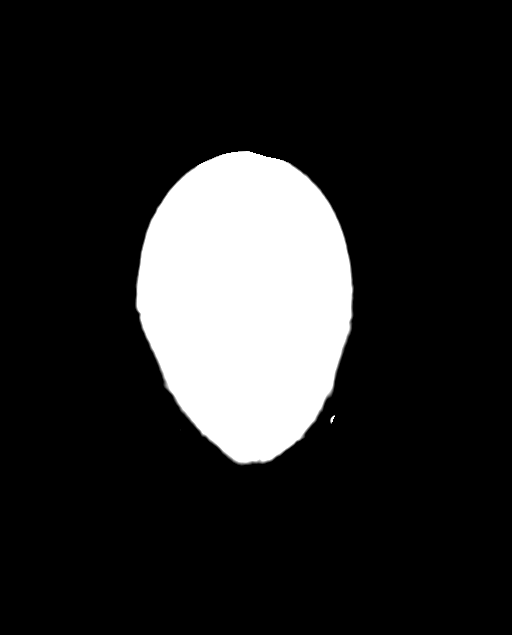

[Series 6: head 3.0 mpr cor · coronal · 0.33mm/px · 3 of 67 slices shown]
[im 23/67  brain]
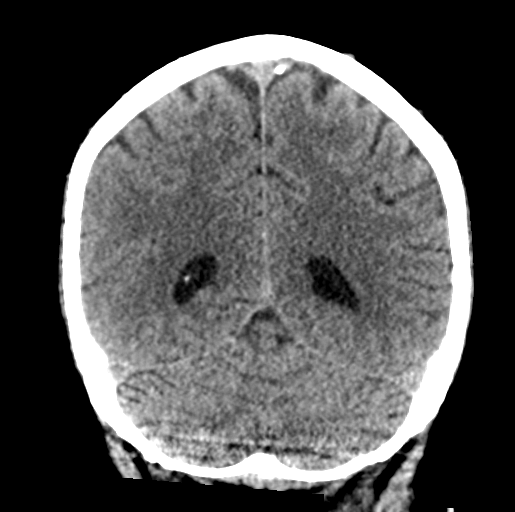
[im 30/67  brain]
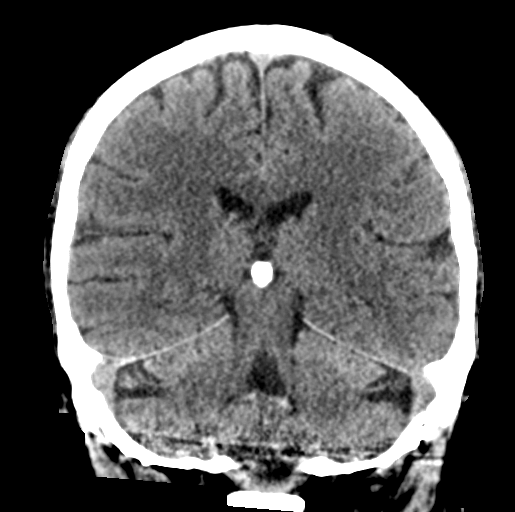
[im 37/67  brain]
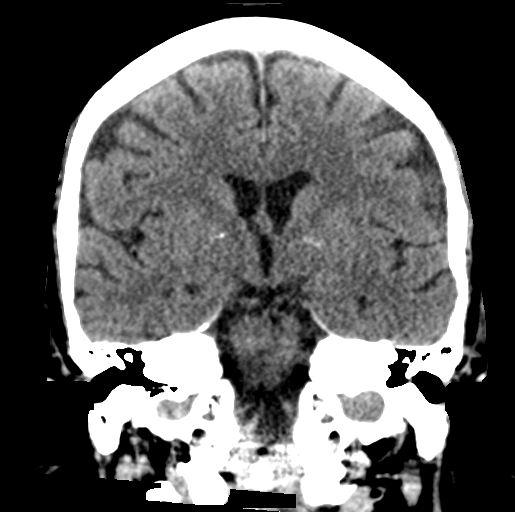

[Series 7: head 3.0 mpr sag · sagittal · 0.34mm/px · 3 of 56 slices shown]
[im 21/56  brain]
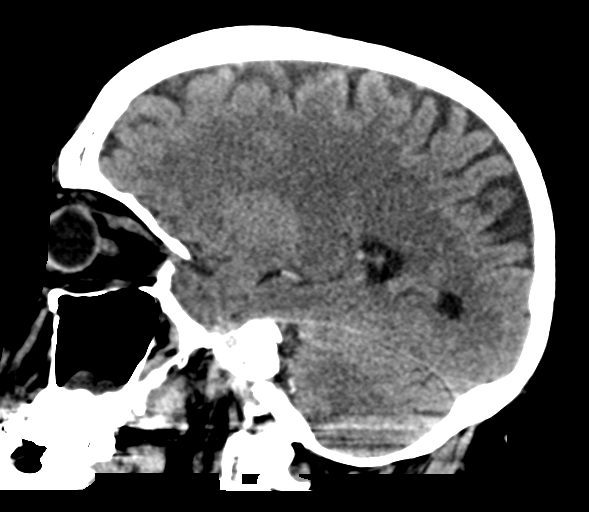
[im 28/56  brain]
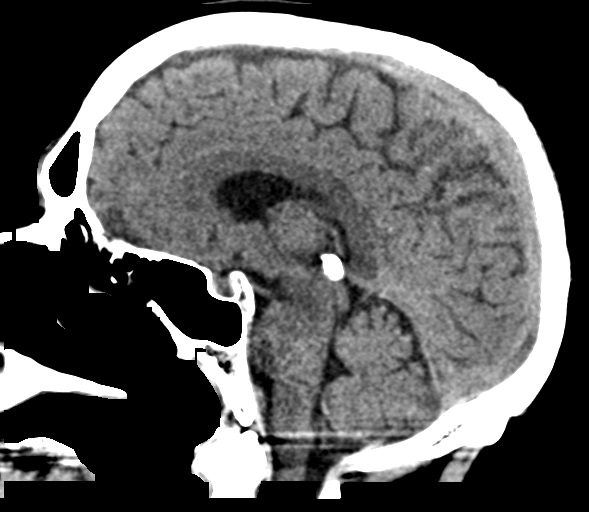
[im 35/56  brain]
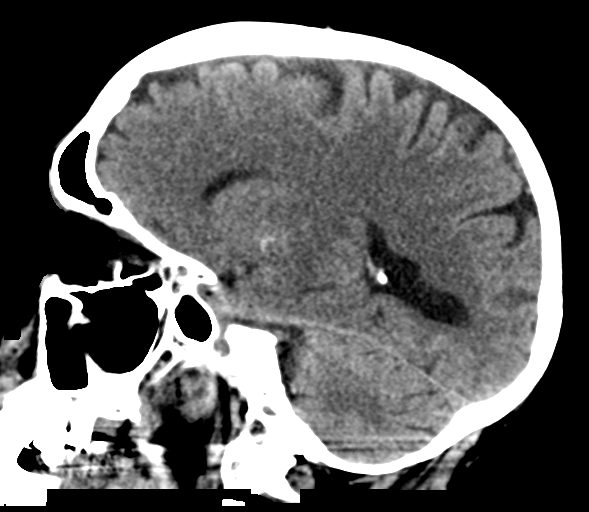

[14 of 47 positions shown; findings below may reference images not displayed]

FINDINGS: Brain: No acute intracranial abnormality. Specifically, no
hemorrhage, hydrocephalus, mass lesion, acute infarction, or
significant intracranial injury.

Vascular: No hyperdense vessel or unexpected calcification.

Skull: No acute calvarial abnormality.

Sinuses/Orbits: No acute findings

Other: None
IMPRESSION: No acute intracranial abnormality.
